# Patient Record
Sex: Female | Born: 1985 | Race: White | Hispanic: No | Smoking: Current every day smoker
Health system: Southern US, Community
[De-identification: ages and names within clinical notes are randomized; demographics above are authoritative.]

## PROBLEM LIST (undated history)

## (undated) ENCOUNTER — Inpatient Hospital Stay (HOSPITAL_COMMUNITY): Payer: Self-pay

## (undated) DIAGNOSIS — D6861 Antiphospholipid syndrome: Secondary | ICD-10-CM

## (undated) DIAGNOSIS — E079 Disorder of thyroid, unspecified: Secondary | ICD-10-CM

## (undated) DIAGNOSIS — D649 Anemia, unspecified: Secondary | ICD-10-CM

## (undated) DIAGNOSIS — F988 Other specified behavioral and emotional disorders with onset usually occurring in childhood and adolescence: Secondary | ICD-10-CM

## (undated) DIAGNOSIS — F419 Anxiety disorder, unspecified: Secondary | ICD-10-CM

## (undated) DIAGNOSIS — M542 Cervicalgia: Secondary | ICD-10-CM

## (undated) DIAGNOSIS — R519 Headache, unspecified: Secondary | ICD-10-CM

## (undated) DIAGNOSIS — N83209 Unspecified ovarian cyst, unspecified side: Secondary | ICD-10-CM

## (undated) DIAGNOSIS — J45909 Unspecified asthma, uncomplicated: Secondary | ICD-10-CM

## (undated) DIAGNOSIS — F329 Major depressive disorder, single episode, unspecified: Secondary | ICD-10-CM

## (undated) DIAGNOSIS — O99320 Drug use complicating pregnancy, unspecified trimester: Secondary | ICD-10-CM

## (undated) DIAGNOSIS — M1990 Unspecified osteoarthritis, unspecified site: Secondary | ICD-10-CM

## (undated) DIAGNOSIS — G2581 Restless legs syndrome: Secondary | ICD-10-CM

## (undated) DIAGNOSIS — M797 Fibromyalgia: Secondary | ICD-10-CM

## (undated) DIAGNOSIS — M549 Dorsalgia, unspecified: Secondary | ICD-10-CM

## (undated) DIAGNOSIS — J42 Unspecified chronic bronchitis: Secondary | ICD-10-CM

## (undated) DIAGNOSIS — N390 Urinary tract infection, site not specified: Secondary | ICD-10-CM

## (undated) DIAGNOSIS — D352 Benign neoplasm of pituitary gland: Secondary | ICD-10-CM

## (undated) DIAGNOSIS — F32A Depression, unspecified: Secondary | ICD-10-CM

## (undated) DIAGNOSIS — N200 Calculus of kidney: Secondary | ICD-10-CM

## (undated) DIAGNOSIS — F1120 Opioid dependence, uncomplicated: Secondary | ICD-10-CM

## (undated) HISTORY — PX: THERAPEUTIC ABORTION: SHX798

## (undated) HISTORY — DX: Unspecified chronic bronchitis: J42

## (undated) HISTORY — PX: WISDOM TOOTH EXTRACTION: SHX21

## (undated) HISTORY — DX: Opioid dependence, uncomplicated: F11.20

## (undated) HISTORY — PX: DILATION AND CURETTAGE OF UTERUS: SHX78

## (undated) HISTORY — DX: Antiphospholipid syndrome: D68.61

## (undated) HISTORY — DX: Drug use complicating pregnancy, unspecified trimester: O99.320

---

## 2008-08-08 ENCOUNTER — Emergency Department (HOSPITAL_COMMUNITY): Admission: EM | Admit: 2008-08-08 | Discharge: 2008-08-08 | Payer: Self-pay | Admitting: Emergency Medicine

## 2008-08-17 ENCOUNTER — Emergency Department (HOSPITAL_COMMUNITY): Admission: EM | Admit: 2008-08-17 | Discharge: 2008-08-17 | Payer: Self-pay | Admitting: Emergency Medicine

## 2008-12-12 ENCOUNTER — Observation Stay (HOSPITAL_COMMUNITY): Admission: EM | Admit: 2008-12-12 | Discharge: 2008-12-12 | Payer: Self-pay | Admitting: Emergency Medicine

## 2009-02-21 ENCOUNTER — Emergency Department (HOSPITAL_COMMUNITY): Admission: EM | Admit: 2009-02-21 | Discharge: 2009-02-21 | Payer: Self-pay | Admitting: Emergency Medicine

## 2009-04-16 ENCOUNTER — Inpatient Hospital Stay (HOSPITAL_COMMUNITY): Admission: AD | Admit: 2009-04-16 | Discharge: 2009-04-16 | Payer: Self-pay | Admitting: Obstetrics and Gynecology

## 2009-04-22 ENCOUNTER — Inpatient Hospital Stay (HOSPITAL_COMMUNITY): Admission: AD | Admit: 2009-04-22 | Discharge: 2009-04-22 | Payer: Self-pay | Admitting: Obstetrics and Gynecology

## 2009-05-11 ENCOUNTER — Emergency Department (HOSPITAL_COMMUNITY): Admission: EM | Admit: 2009-05-11 | Discharge: 2009-05-11 | Payer: Self-pay | Admitting: Emergency Medicine

## 2009-05-12 ENCOUNTER — Emergency Department (HOSPITAL_COMMUNITY): Admission: EM | Admit: 2009-05-12 | Discharge: 2009-05-12 | Payer: Self-pay | Admitting: Emergency Medicine

## 2009-10-26 ENCOUNTER — Emergency Department (HOSPITAL_COMMUNITY): Admission: EM | Admit: 2009-10-26 | Discharge: 2009-10-26 | Payer: Self-pay | Admitting: Emergency Medicine

## 2010-04-26 ENCOUNTER — Inpatient Hospital Stay (HOSPITAL_COMMUNITY): Admission: AD | Admit: 2010-04-26 | Discharge: 2010-04-26 | Payer: Self-pay | Admitting: Obstetrics and Gynecology

## 2010-04-26 ENCOUNTER — Ambulatory Visit: Payer: Self-pay | Admitting: Gynecology

## 2010-05-27 ENCOUNTER — Inpatient Hospital Stay (HOSPITAL_COMMUNITY): Admission: AD | Admit: 2010-05-27 | Discharge: 2010-05-30 | Payer: Self-pay | Admitting: Obstetrics and Gynecology

## 2010-08-14 ENCOUNTER — Emergency Department (HOSPITAL_COMMUNITY)
Admission: EM | Admit: 2010-08-14 | Discharge: 2010-08-14 | Payer: Self-pay | Source: Home / Self Care | Admitting: Emergency Medicine

## 2010-11-16 LAB — CBC
HCT: 35.2 % — ABNORMAL LOW (ref 36.0–46.0)
Hemoglobin: 12.1 g/dL (ref 12.0–15.0)
Hemoglobin: 13.7 g/dL (ref 12.0–15.0)
RBC: 3.74 MIL/uL — ABNORMAL LOW (ref 3.87–5.11)
RBC: 4.3 MIL/uL (ref 3.87–5.11)
RDW: 13.4 % (ref 11.5–15.5)
WBC: 9.6 10*3/uL (ref 4.0–10.5)

## 2010-11-16 LAB — RPR: RPR Ser Ql: NONREACTIVE

## 2010-11-17 LAB — URINALYSIS, ROUTINE W REFLEX MICROSCOPIC
Glucose, UA: NEGATIVE mg/dL
Hgb urine dipstick: NEGATIVE
Specific Gravity, Urine: 1.01 (ref 1.005–1.030)
Urobilinogen, UA: 0.2 mg/dL (ref 0.0–1.0)
pH: 7 (ref 5.0–8.0)

## 2010-12-08 LAB — URINE MICROSCOPIC-ADD ON

## 2010-12-08 LAB — HCG, QUANTITATIVE, PREGNANCY: hCG, Beta Chain, Quant, S: 64 m[IU]/mL — ABNORMAL HIGH (ref <5)

## 2010-12-08 LAB — URINALYSIS, ROUTINE W REFLEX MICROSCOPIC
Glucose, UA: NEGATIVE mg/dL
Hgb urine dipstick: NEGATIVE
Ketones, ur: NEGATIVE mg/dL
Nitrite: NEGATIVE
Protein, ur: NEGATIVE mg/dL
Specific Gravity, Urine: 1.026 (ref 1.005–1.030)
Urobilinogen, UA: 1 mg/dL (ref 0.0–1.0)
pH: 6 (ref 5.0–8.0)

## 2010-12-08 LAB — POCT I-STAT, CHEM 8
BUN: 11 mg/dL (ref 6–23)
Calcium, Ion: 1.12 mmol/L (ref 1.12–1.32)
Chloride: 108 mEq/L (ref 96–112)
Glucose, Bld: 81 mg/dL (ref 70–99)
HCT: 33 % — ABNORMAL LOW (ref 36.0–46.0)

## 2010-12-08 LAB — PREGNANCY, URINE: Preg Test, Ur: POSITIVE

## 2010-12-09 LAB — CBC
HCT: 28.3 % — ABNORMAL LOW (ref 36.0–46.0)
Hemoglobin: 9.8 g/dL — ABNORMAL LOW (ref 12.0–15.0)
MCHC: 34.5 g/dL (ref 30.0–36.0)
RDW: 12.7 % (ref 11.5–15.5)

## 2010-12-10 LAB — CBC
MCHC: 34.6 g/dL (ref 30.0–36.0)
MCV: 92.9 fL (ref 78.0–100.0)
Platelets: 238 10*3/uL (ref 150–400)
RDW: 12.9 % (ref 11.5–15.5)

## 2010-12-10 LAB — GC/CHLAMYDIA PROBE AMP, GENITAL: GC Probe Amp, Genital: NEGATIVE

## 2010-12-11 LAB — URINALYSIS, ROUTINE W REFLEX MICROSCOPIC
Bilirubin Urine: NEGATIVE
Glucose, UA: NEGATIVE mg/dL
Glucose, UA: NEGATIVE mg/dL
Specific Gravity, Urine: 1.024 (ref 1.005–1.030)
Specific Gravity, Urine: 1.025 (ref 1.005–1.030)
Urobilinogen, UA: 0.2 mg/dL (ref 0.0–1.0)
Urobilinogen, UA: 1 mg/dL (ref 0.0–1.0)

## 2010-12-11 LAB — URINE MICROSCOPIC-ADD ON

## 2010-12-13 LAB — COMPREHENSIVE METABOLIC PANEL
ALT: 14 U/L (ref 0–35)
AST: 21 U/L (ref 0–37)
Alkaline Phosphatase: 103 U/L (ref 39–117)
CO2: 23 mEq/L (ref 19–32)
Chloride: 103 mEq/L (ref 96–112)
GFR calc Af Amer: >60 mL/min (ref >60)
GFR calc non Af Amer: >60 mL/min (ref >60)
Potassium: 3.7 mEq/L (ref 3.5–5.1)
Sodium: 135 mEq/L (ref 135–145)
Total Bilirubin: 1.2 mg/dL (ref 0.3–1.2)

## 2010-12-13 LAB — RAPID URINE DRUG SCREEN, HOSP PERFORMED
Benzodiazepines: POSITIVE — AB
Cocaine: POSITIVE — AB
Tetrahydrocannabinol: POSITIVE — AB

## 2010-12-13 LAB — POCT CARDIAC MARKERS
CKMB, poc: <1 ng/mL — ABNORMAL LOW (ref 1.0–8.0)
Myoglobin, poc: 31.4 ng/mL (ref 12–200)
Troponin i, poc: <0.05 ng/mL (ref 0.00–0.09)

## 2010-12-13 LAB — POCT PREGNANCY, URINE: Preg Test, Ur: NEGATIVE

## 2010-12-17 ENCOUNTER — Emergency Department (HOSPITAL_COMMUNITY): Payer: Self-pay

## 2010-12-17 ENCOUNTER — Emergency Department (HOSPITAL_COMMUNITY)
Admission: EM | Admit: 2010-12-17 | Discharge: 2010-12-17 | Disposition: A | Discharge status: Home / Self Care | Payer: Self-pay | Attending: Emergency Medicine | Admitting: Emergency Medicine

## 2010-12-17 DIAGNOSIS — M533 Sacrococcygeal disorders, not elsewhere classified: Secondary | ICD-10-CM | POA: Insufficient documentation

## 2010-12-17 DIAGNOSIS — S9030XA Contusion of unspecified foot, initial encounter: Secondary | ICD-10-CM | POA: Insufficient documentation

## 2010-12-17 DIAGNOSIS — W108XXA Fall (on) (from) other stairs and steps, initial encounter: Secondary | ICD-10-CM | POA: Insufficient documentation

## 2010-12-17 DIAGNOSIS — S0990XA Unspecified injury of head, initial encounter: Secondary | ICD-10-CM | POA: Insufficient documentation

## 2010-12-17 DIAGNOSIS — S139XXA Sprain of joints and ligaments of unspecified parts of neck, initial encounter: Secondary | ICD-10-CM | POA: Insufficient documentation

## 2010-12-17 DIAGNOSIS — M25569 Pain in unspecified knee: Secondary | ICD-10-CM | POA: Insufficient documentation

## 2010-12-17 DIAGNOSIS — Y92009 Unspecified place in unspecified non-institutional (private) residence as the place of occurrence of the external cause: Secondary | ICD-10-CM | POA: Insufficient documentation

## 2010-12-17 DIAGNOSIS — S20229A Contusion of unspecified back wall of thorax, initial encounter: Secondary | ICD-10-CM | POA: Insufficient documentation

## 2011-03-19 ENCOUNTER — Inpatient Hospital Stay (HOSPITAL_COMMUNITY)
Admission: RE | Admit: 2011-03-19 | Discharge: 2011-03-19 | Disposition: A | Discharge status: Home / Self Care | Payer: Self-pay | Source: Ambulatory Visit | Attending: Emergency Medicine | Admitting: Emergency Medicine

## 2011-03-20 ENCOUNTER — Inpatient Hospital Stay (INDEPENDENT_AMBULATORY_CARE_PROVIDER_SITE_OTHER)
Admission: RE | Admit: 2011-03-20 | Discharge: 2011-03-20 | Disposition: A | Discharge status: Home / Self Care | Payer: Self-pay | Source: Ambulatory Visit | Attending: Family Medicine | Admitting: Family Medicine

## 2011-03-20 DIAGNOSIS — B86 Scabies: Secondary | ICD-10-CM

## 2011-04-16 ENCOUNTER — Inpatient Hospital Stay (INDEPENDENT_AMBULATORY_CARE_PROVIDER_SITE_OTHER)
Admission: RE | Admit: 2011-04-16 | Discharge: 2011-04-16 | Disposition: A | Discharge status: Home / Self Care | Payer: Self-pay | Source: Ambulatory Visit | Attending: Family Medicine | Admitting: Family Medicine

## 2011-04-16 DIAGNOSIS — K089 Disorder of teeth and supporting structures, unspecified: Secondary | ICD-10-CM

## 2011-04-22 ENCOUNTER — Emergency Department (HOSPITAL_COMMUNITY): Payer: Self-pay

## 2011-04-22 ENCOUNTER — Emergency Department (HOSPITAL_COMMUNITY)
Admission: EM | Admit: 2011-04-22 | Discharge: 2011-04-22 | Disposition: A | Discharge status: Home / Self Care | Payer: Self-pay | Attending: Emergency Medicine | Admitting: Emergency Medicine

## 2011-04-22 DIAGNOSIS — M26629 Arthralgia of temporomandibular joint, unspecified side: Secondary | ICD-10-CM | POA: Insufficient documentation

## 2011-04-22 DIAGNOSIS — Z79899 Other long term (current) drug therapy: Secondary | ICD-10-CM | POA: Insufficient documentation

## 2011-04-22 DIAGNOSIS — R6884 Jaw pain: Secondary | ICD-10-CM | POA: Insufficient documentation

## 2011-04-22 DIAGNOSIS — F341 Dysthymic disorder: Secondary | ICD-10-CM | POA: Insufficient documentation

## 2011-05-07 ENCOUNTER — Emergency Department (HOSPITAL_COMMUNITY)
Admission: EM | Admit: 2011-05-07 | Discharge: 2011-05-07 | Discharge status: Against Medical Advice | Payer: Self-pay | Attending: Emergency Medicine | Admitting: Emergency Medicine

## 2011-05-07 DIAGNOSIS — R059 Cough, unspecified: Secondary | ICD-10-CM | POA: Insufficient documentation

## 2011-05-07 DIAGNOSIS — R05 Cough: Secondary | ICD-10-CM | POA: Insufficient documentation

## 2011-05-07 DIAGNOSIS — R0989 Other specified symptoms and signs involving the circulatory and respiratory systems: Secondary | ICD-10-CM | POA: Insufficient documentation

## 2011-05-07 DIAGNOSIS — R070 Pain in throat: Secondary | ICD-10-CM | POA: Insufficient documentation

## 2011-05-08 ENCOUNTER — Inpatient Hospital Stay (INDEPENDENT_AMBULATORY_CARE_PROVIDER_SITE_OTHER)
Admission: RE | Admit: 2011-05-08 | Discharge: 2011-05-08 | Disposition: A | Discharge status: Home / Self Care | Payer: Self-pay | Source: Ambulatory Visit | Attending: Family Medicine | Admitting: Family Medicine

## 2011-05-08 DIAGNOSIS — G43009 Migraine without aura, not intractable, without status migrainosus: Secondary | ICD-10-CM

## 2011-05-08 DIAGNOSIS — J45909 Unspecified asthma, uncomplicated: Secondary | ICD-10-CM

## 2011-05-10 ENCOUNTER — Emergency Department (HOSPITAL_COMMUNITY)
Admission: EM | Admit: 2011-05-10 | Discharge: 2011-05-10 | Discharge status: Against Medical Advice | Payer: Self-pay | Attending: Emergency Medicine | Admitting: Emergency Medicine

## 2011-05-10 DIAGNOSIS — Z0389 Encounter for observation for other suspected diseases and conditions ruled out: Secondary | ICD-10-CM | POA: Insufficient documentation

## 2011-06-08 LAB — BASIC METABOLIC PANEL
BUN: 8 mg/dL (ref 6–23)
Calcium: 9.5 mg/dL (ref 8.4–10.5)
GFR calc non Af Amer: >60 mL/min (ref >60)
Potassium: 3.8 mEq/L (ref 3.5–5.1)
Sodium: 139 mEq/L (ref 135–145)

## 2011-06-08 LAB — DIFFERENTIAL
Basophils Absolute: 0 10*3/uL (ref 0.0–0.1)
Eosinophils Relative: 4 % (ref 0–5)
Lymphocytes Relative: 41 % (ref 12–46)
Lymphs Abs: 3.3 10*3/uL (ref 0.7–4.0)
Neutro Abs: 3.8 10*3/uL (ref 1.7–7.7)

## 2011-06-08 LAB — CBC
HCT: 43.8 % (ref 36.0–46.0)
Platelets: 261 10*3/uL (ref 150–400)
WBC: 8.1 10*3/uL (ref 4.0–10.5)

## 2011-06-08 LAB — D-DIMER, QUANTITATIVE: D-Dimer, Quant: <0.22 ug/mL-FEU (ref 0.00–0.48)

## 2012-06-07 ENCOUNTER — Emergency Department (INDEPENDENT_AMBULATORY_CARE_PROVIDER_SITE_OTHER)
Admission: EM | Admit: 2012-06-07 | Discharge: 2012-06-07 | Disposition: A | Discharge status: Home / Self Care | Payer: Medicaid - Out of State | Source: Home / Self Care | Attending: Emergency Medicine | Admitting: Emergency Medicine

## 2012-06-07 ENCOUNTER — Encounter (HOSPITAL_COMMUNITY): Payer: Self-pay | Admitting: *Deleted

## 2012-06-07 DIAGNOSIS — J209 Acute bronchitis, unspecified: Secondary | ICD-10-CM

## 2012-06-07 DIAGNOSIS — J45909 Unspecified asthma, uncomplicated: Secondary | ICD-10-CM

## 2012-06-07 HISTORY — DX: Fibromyalgia: M79.7

## 2012-06-07 MED ORDER — ALBUTEROL SULFATE HFA 108 (90 BASE) MCG/ACT IN AERS: 1.0000 | INHALATION_SPRAY | Freq: Four times a day (QID) | RESPIRATORY_TRACT | Status: DC | PRN

## 2012-06-07 MED ORDER — PREDNISONE 5 MG PO KIT: 1.0000 | PACK | Freq: Every day | ORAL | Status: DC

## 2012-06-07 MED ORDER — BENZONATATE 200 MG PO CAPS: 200.0000 mg | ORAL_CAPSULE | Freq: Three times a day (TID) | ORAL | Status: DC | PRN

## 2012-06-07 MED ORDER — AMOXICILLIN 500 MG PO CAPS: 1000.0000 mg | ORAL_CAPSULE | Freq: Three times a day (TID) | ORAL | Status: DC

## 2012-06-07 NOTE — ED Notes (Signed)
Pt    Caregiver  Reports  Symptoms   Of  Cough  /  Congestion  As  Well  As    stuffynose       She  Ambulated  To  Room  With a  Steady  Fluid  Gait  She       Appears  In no  Acute  Distress            Skin is  Warm  And    Dry

## 2012-06-07 NOTE — ED Provider Notes (Signed)
Chief Complaint  Patient presents with  . Cough    History of Present Illness:   The patient is a 26 year old female who presents today with a four-day history of cough productive of green sputum, wheezing, and chest tightness. She has a history of asthma which she treats with as needed albuterol. She has also some nasal congestion without any drainage, migraine headache, sweats, and sore throat. She denies any fever or chills. She's had no chest pain or GI complaints. She also has a history of fibromyalgia for which he takes Neurontin, anxiety for which she takes alprazolam, and hypothyroidism for which she takes levothyroxine.  Review of Systems:  Other than noted above, the patient denies any of the following symptoms. Systemic:  No fever, chills, sweats, fatigue, myalgias, headache, or anorexia. Eye:  No redness, pain or drainage. ENT:  No earache, ear congestion, nasal congestion, sneezing, rhinorrhea, sinus pressure, sinus pain, post nasal drip, or sore throat. Lungs:  No cough, sputum production, wheezing, shortness of breath, or chest pain. GI:  No abdominal pain, nausea, vomiting, or diarrhea.  PMFSH:  Past medical history, family history, social history, meds, and allergies were reviewed.  Physical Exam:   Vital signs:  BP 126/68  Pulse 90  Temp 98.1 F (36.7 C) (Oral)  Resp 18  SpO2 99%  LMP 06/07/2012 General:  Alert, in no distress. Eye:  No conjunctival injection or drainage. Lids were normal. ENT:  TMs and canals were normal, without erythema or inflammation.  Nasal mucosa was clear and uncongested, without drainage.  Mucous membranes were moist.  Pharynx was clear, without exudate or drainage.  There were no oral ulcerations or lesions. Neck:  Supple, no adenopathy, tenderness or mass. Lungs:  No respiratory distress.  Lungs were clear to auscultation, without wheezes, rales or rhonchi.  Breath sounds were clear and equal bilaterally.  Heart:  Regular rhythm, without  gallops, murmers or rubs. Skin:  Clear, warm, and dry, without rash or lesions.  Assessment:  The primary encounter diagnosis was Acute bronchitis. A diagnosis of Asthma was also pertinent to this visit.  Plan:   1.  The following meds were prescribed:   New Prescriptions   ALBUTEROL (PROVENTIL HFA;VENTOLIN HFA) 108 (90 BASE) MCG/ACT INHALER    Inhale 1-2 puffs into the lungs every 6 (six) hours as needed for wheezing.   AMOXICILLIN (AMOXIL) 500 MG CAPSULE    Take 2 capsules (1,000 mg total) by mouth 3 (three) times daily.   BENZONATATE (TESSALON) 200 MG CAPSULE    Take 1 capsule (200 mg total) by mouth 3 (three) times daily as needed for cough.   PREDNISONE 5 MG KIT    Take 1 kit (5 mg total) by mouth daily after breakfast. Prednisone 5 mg 6 day dosepack.  Take as directed.   2.  The patient was instructed in symptomatic care and handouts were given. 3.  The patient was told to return if becoming worse in any way, if no better in 3 or 4 days, and given some red flag symptoms that would indicate earlier return.   Reuben Likes, MD 06/07/12 [phone]

## 2012-06-10 ENCOUNTER — Encounter (HOSPITAL_COMMUNITY): Payer: Self-pay | Admitting: Emergency Medicine

## 2012-06-10 ENCOUNTER — Emergency Department (INDEPENDENT_AMBULATORY_CARE_PROVIDER_SITE_OTHER)
Admission: EM | Admit: 2012-06-10 | Discharge: 2012-06-10 | Disposition: A | Discharge status: Home / Self Care | Payer: Self-pay | Source: Home / Self Care | Attending: Family Medicine | Admitting: Family Medicine

## 2012-06-10 DIAGNOSIS — Z760 Encounter for issue of repeat prescription: Secondary | ICD-10-CM

## 2012-06-10 DIAGNOSIS — F411 Generalized anxiety disorder: Secondary | ICD-10-CM

## 2012-06-10 DIAGNOSIS — F329 Major depressive disorder, single episode, unspecified: Secondary | ICD-10-CM

## 2012-06-10 HISTORY — DX: Unspecified asthma, uncomplicated: J45.909

## 2012-06-10 HISTORY — DX: Other specified behavioral and emotional disorders with onset usually occurring in childhood and adolescence: F98.8

## 2012-06-10 HISTORY — DX: Dorsalgia, unspecified: M54.9

## 2012-06-10 HISTORY — DX: Anxiety disorder, unspecified: F41.9

## 2012-06-10 HISTORY — DX: Cervicalgia: M54.2

## 2012-06-10 HISTORY — DX: Unspecified osteoarthritis, unspecified site: M19.90

## 2012-06-10 HISTORY — DX: Major depressive disorder, single episode, unspecified: F32.9

## 2012-06-10 HISTORY — DX: Depression, unspecified: F32.A

## 2012-06-10 MED ORDER — METHYLPHENIDATE HCL 20 MG PO TABS: ORAL_TABLET | ORAL | Status: DC

## 2012-06-10 MED ORDER — ALPRAZOLAM 1 MG PO TABS: ORAL_TABLET | ORAL | Status: DC

## 2012-06-10 NOTE — ED Notes (Signed)
Requesting refill of xanax and methylphenidate.  Reports being out of medicines for one week. Reports primary is in [place].

## 2012-06-10 NOTE — ED Provider Notes (Signed)
Medical screening examination/treatment/procedure(s) were performed by resident physician or non-physician practitioner and as supervising physician I was immediately available for consultation/collaboration.   Kailey Esquilin DOUGLAS MD.    Siri Buege D Sheehan Stacey, MD 06/10/12 2100 

## 2012-06-10 NOTE — ED Provider Notes (Signed)
History     CSN: [id number]  Arrival date & time 06/10/12  1722   First MD Initiated Contact with Patient 06/10/12 1829      Chief Complaint  Patient presents with  . Medication Refill    (Consider location/radiation/quality/duration/timing/severity/associated sxs/prior treatment) HPI Comments: States she is up her temporarily from [place].  She has run out of meds and needs refills.  Is thinking about moving here permanently.  The history is provided by the patient. No language interpreter was used.    Past Medical History  Diagnosis Date  . Fibromyalgia   . Arthritis   . Asthma   . Neck pain   . Back pain   . Anxiety   . Depression   . Attention deficit disorder (ADD)     History reviewed. No pertinent past surgical history.  No family history on file.  History  Substance Use Topics  . Smoking status: Current Every Day Smoker  . Smokeless tobacco: Not on file  . Alcohol Use: Yes    OB History    Grav Para Term Preterm Abortions TAB SAB Ect Mult Living                  Review of Systems  Constitutional: Negative for fever and chills.  Psychiatric/Behavioral: Negative for confusion.  All other systems reviewed and are negative.    Allergies  Review of patient's allergies indicates no known allergies.  Home Medications   Current Outpatient Rx  Name Route Sig Dispense Refill  . GABAPENTIN 300 MG PO CAPS Oral Take 300 mg by mouth 3 (three) times daily.    Marland Kitchen LEVOTHYROXINE SODIUM 25 MCG PO TABS Oral Take 25 mcg by mouth daily.    . TRAMADOL HCL 50 MG PO TABS Oral Take 50 mg by mouth every 6 (six) hours as needed.    . ALBUTEROL SULFATE HFA 108 (90 BASE) MCG/ACT IN AERS Inhalation Inhale 1-2 puffs into the lungs every 6 (six) hours as needed for wheezing. 1 Inhaler 0  . ALPRAZOLAM 1 MG PO TABS  One tab po TID 42 tablet 0  . AMOXICILLIN 500 MG PO CAPS Oral Take 2 capsules (1,000 mg total) by mouth 3 (three) times daily. 60 capsule 0    Dispense as  written.  Marland Kitchen BENZONATATE 200 MG PO CAPS Oral Take 1 capsule (200 mg total) by mouth 3 (three) times daily as needed for cough. 30 capsule 0  . METHYLPHENIDATE HCL 20 MG PO TABS  One tab po TID 42 tablet 0  . PREDNISONE 5 MG PO KIT Oral Take 1 kit (5 mg total) by mouth daily after breakfast. Prednisone 5 mg 6 day dosepack.  Take as directed. 1 kit 0    BP 118/66  Pulse 74  Temp 98.2 F (36.8 C) (Oral)  Resp 16  SpO2 98%  LMP 06/07/2012  Physical Exam  Nursing note and vitals reviewed. Constitutional: She is oriented to person, place, and time. She appears well-developed and well-nourished. No distress.  HENT:  Head: Normocephalic and atraumatic.  Eyes: EOM are normal.  Neck: Normal range of motion.  Cardiovascular: Normal rate, regular rhythm and normal heart sounds.   Pulmonary/Chest: Effort normal and breath sounds normal.  Abdominal: Soft. She exhibits no distension. There is no tenderness.  Musculoskeletal: Normal range of motion.  Neurological: She is alert and oriented to person, place, and time. Coordination and gait normal. GCS eye subscore is 4. GCS verbal subscore is 5. GCS motor subscore is  6.  Skin: Skin is warm and dry.  Psychiatric: She has a normal mood and affect. Judgment normal.    ED Course  Procedures (including critical care time)  Labs Reviewed - No data to display No results found.   1. Medication refill       MDM  [id number]-42 rx-ritalin ,20 Find a PCP        Evalina Field, [place] 06/10/12 1940

## 2012-09-15 ENCOUNTER — Encounter (HOSPITAL_COMMUNITY): Payer: Self-pay | Admitting: *Deleted

## 2012-09-15 ENCOUNTER — Emergency Department (HOSPITAL_COMMUNITY)
Admission: EM | Admit: 2012-09-15 | Discharge: 2012-09-15 | Discharge status: Against Medical Advice | Payer: Medicaid Other | Attending: Emergency Medicine | Admitting: Emergency Medicine

## 2012-09-15 DIAGNOSIS — J029 Acute pharyngitis, unspecified: Secondary | ICD-10-CM | POA: Insufficient documentation

## 2012-09-15 DIAGNOSIS — R197 Diarrhea, unspecified: Secondary | ICD-10-CM | POA: Insufficient documentation

## 2012-09-15 DIAGNOSIS — R05 Cough: Secondary | ICD-10-CM | POA: Insufficient documentation

## 2012-09-15 DIAGNOSIS — Z3202 Encounter for pregnancy test, result negative: Secondary | ICD-10-CM | POA: Insufficient documentation

## 2012-09-15 DIAGNOSIS — R509 Fever, unspecified: Secondary | ICD-10-CM | POA: Insufficient documentation

## 2012-09-15 DIAGNOSIS — R059 Cough, unspecified: Secondary | ICD-10-CM | POA: Insufficient documentation

## 2012-09-15 DIAGNOSIS — R51 Headache: Secondary | ICD-10-CM | POA: Insufficient documentation

## 2012-09-15 LAB — BASIC METABOLIC PANEL
Calcium: 10.2 mg/dL (ref 8.4–10.5)
Chloride: 102 mEq/L (ref 96–112)
Creatinine, Ser: 0.71 mg/dL (ref 0.50–1.10)
GFR calc Af Amer: >90 mL/min (ref >90)
GFR calc non Af Amer: >90 mL/min (ref >90)

## 2012-09-15 LAB — CBC WITH DIFFERENTIAL/PLATELET
Basophils Absolute: 0 10*3/uL (ref 0.0–0.1)
Basophils Relative: 0 % (ref 0–1)
Eosinophils Absolute: 0.1 10*3/uL (ref 0.0–0.7)
Eosinophils Relative: 1 % (ref 0–5)
HCT: 45.1 % (ref 36.0–46.0)
MCHC: 32.8 g/dL (ref 30.0–36.0)
Monocytes Absolute: 0.5 10*3/uL (ref 0.1–1.0)
Neutro Abs: 4.3 10*3/uL (ref 1.7–7.7)
RDW: 13.1 % (ref 11.5–15.5)

## 2012-09-15 LAB — URINALYSIS, ROUTINE W REFLEX MICROSCOPIC
Ketones, ur: NEGATIVE mg/dL
Nitrite: NEGATIVE
Protein, ur: NEGATIVE mg/dL
Urobilinogen, UA: 1 mg/dL (ref 0.0–1.0)

## 2012-09-15 LAB — LIPASE, BLOOD: Lipase: 17 U/L (ref 11–59)

## 2012-09-15 NOTE — ED Notes (Signed)
Called for triage no answer in the lobby

## 2012-09-15 NOTE — ED Notes (Signed)
Pt is here with symptoms over the last 4 days of headache, sorethroat, diarrhea, abdominal pain, chills, and fever

## 2012-10-03 ENCOUNTER — Emergency Department (INDEPENDENT_AMBULATORY_CARE_PROVIDER_SITE_OTHER)
Admission: EM | Admit: 2012-10-03 | Discharge: 2012-10-03 | Disposition: A | Discharge status: Home / Self Care | Payer: Medicaid Other | Source: Home / Self Care

## 2012-10-03 ENCOUNTER — Emergency Department (INDEPENDENT_AMBULATORY_CARE_PROVIDER_SITE_OTHER): Payer: Medicaid Other

## 2012-10-03 ENCOUNTER — Encounter (HOSPITAL_COMMUNITY): Payer: Self-pay | Admitting: *Deleted

## 2012-10-03 DIAGNOSIS — M7120 Synovial cyst of popliteal space [Baker], unspecified knee: Secondary | ICD-10-CM

## 2012-10-03 DIAGNOSIS — M660 Rupture of popliteal cyst: Secondary | ICD-10-CM

## 2012-10-03 HISTORY — DX: Disorder of thyroid, unspecified: E07.9

## 2012-10-03 MED ORDER — NAPROXEN 500 MG PO TABS: 500.0000 mg | ORAL_TABLET | Freq: Two times a day (BID) | ORAL | Status: DC

## 2012-10-03 MED ORDER — HYDROCODONE-ACETAMINOPHEN 5-325 MG PO TABS: 1.0000 | ORAL_TABLET | Freq: Four times a day (QID) | ORAL | Status: DC | PRN

## 2012-10-03 NOTE — ED Provider Notes (Signed)
History     CSN: [id number]  Arrival date & time 10/03/12  1810   First MD Initiated Contact with Patient 10/03/12 1826      No chief complaint on file.   (Consider location/radiation/quality/duration/timing/severity/associated sxs/prior treatment) HPI 27 y.o. female with right knee/leg pain since slipping/falling on stairs 3-4 days ago. [place] Seat forward going upstairs and hit right knee against concrete step. Immediate pain, swelling, difficulty bearing weight. Has been using ice, heat, tylenol/NSAIDs, tramadol and elevation. Not getting any better. Swelling has decreased. Had pain/tightness down entire calf and part way up thigh. This has improved greatly. Has some tingling in plantar aspect of foot when bears weight still. Felt popping sensation when bearing weight.  Pain is medial aspect of knee and back of knee. Walking, pressure, flexing, any movement hurts. Has history of knee problems since adolescences when she was active in cheerleading. But no pain until recent fall.   Past Medical History  Diagnosis Date  . Fibromyalgia   . Arthritis   . Asthma   . Neck pain   . Back pain   . Anxiety   . Depression   . Attention deficit disorder (ADD)    History reviewed. No pertinent past surgical history.  No family history on file.  History  Substance Use Topics  . Smoking status: Current Every Day Smoker  . Smokeless tobacco: Not on file  . Alcohol Use: Yes    OB History    Grav Para Term Preterm Abortions TAB SAB Ect Mult Living                  Review of Systems  Constitutional: Negative for fever, chills and diaphoresis.  Respiratory: Negative for shortness of breath.   Cardiovascular: Negative for chest pain and palpitations.  Musculoskeletal:       See HPI  Skin: Negative for rash.  Neurological: Negative for dizziness.    Allergies  Review of patient's allergies indicates no known allergies.  Home Medications   Current Outpatient Rx  Name  Route  Sig   Dispense  Refill  . ALBUTEROL SULFATE HFA 108 (90 BASE) MCG/ACT IN AERS   Inhalation   Inhale 1-2 puffs into the lungs every 6 (six) hours as needed for wheezing.   1 Inhaler   0   . ALPRAZOLAM 1 MG PO TABS      One tab po TID   42 tablet   0   . AMOXICILLIN 500 MG PO CAPS   Oral   Take 2 capsules (1,000 mg total) by mouth 3 (three) times daily.   60 capsule   0     Dispense as written.   Marland Kitchen BENZONATATE 200 MG PO CAPS   Oral   Take 1 capsule (200 mg total) by mouth 3 (three) times daily as needed for cough.   30 capsule   0   . GABAPENTIN 300 MG PO CAPS   Oral   Take 300 mg by mouth 3 (three) times daily.         Marland Kitchen LEVOTHYROXINE SODIUM 25 MCG PO TABS   Oral   Take 25 mcg by mouth daily.         . METHYLPHENIDATE HCL 20 MG PO TABS      One tab po TID   42 tablet   0   . PREDNISONE 5 MG PO KIT   Oral   Take 1 kit (5 mg total) by mouth daily after breakfast. Prednisone 5 mg 6  day dosepack.  Take as directed.   1 kit   0   . TRAMADOL HCL 50 MG PO TABS   Oral   Take 50 mg by mouth every 6 (six) hours as needed.           There were no vitals taken for this visit.  Physical Exam  Constitutional: She is oriented to person, place, and time. She appears well-developed and well-nourished. No distress.  HENT:  Head: Normocephalic and atraumatic.  Eyes: Conjunctivae normal and EOM are normal.  Neck: Normal range of motion. Neck supple.  Cardiovascular: Normal rate, regular rhythm and normal heart sounds.   Pulmonary/Chest: Effort normal and breath sounds normal. No respiratory distress.  Musculoskeletal:       R knee with effusion. Not red or warm. Tender diffusely but especially medial aspect along joint line. Pain with passive and active flexion, with valgus pressure. Some discoloration of skin over patellar area. Sensation intact. Pulses intact in R foot, foot warm and well perfused. No calf tenderness, cords, swelling, redness.  Neurological: She is alert  and oriented to person, place, and time.  Skin: Skin is warm and dry.  Psychiatric: She has a normal mood and affect.   Filed Vitals:   10/03/12 1902  BP: 110/72  Pulse: 72  Temp: 98.7 F (37.1 C)  Resp: 16    ED Course  Procedures (including critical care time)  Labs Reviewed - No data to display Dg Knee Complete 4 Views Right  10/03/2012  *RADIOLOGY REPORT*  Clinical Data: Fall, knee injury  RIGHT KNEE - COMPLETE 4+ VIEW  Comparison: None.  Findings: No fracture or dislocation is seen.  The joint spaces are preserved.  The visualized soft tissues are unremarkable.  No suprapatellar knee joint effusion.  IMPRESSION: Normal knee radiographs.   Original Report Authenticated By: Charline Bills, M.D.     1. Ruptured synovial cyst of popliteal space   History and exam consistent with ruptured popliteal cyst vs meniscal injury. No fracture. Neuro-vascularly intact in calf and foot Short knee immobilizer for comfort, crutches NSAIDs, ice, rest, elevation Pt advised to seek f/u with a primary care physician or orthopedist.  MDM          Napoleon Form, MD 10/06/12 [phone]

## 2012-10-03 NOTE — ED Notes (Signed)
Going up steps and missed a step.  Hit R knee on cement step on 1/27.  C/o pain, redness and swelling to R ant. Knee.  When she bears wt. She gets tingling sensation in her instep.  Said her foot was asleep for 2 days when it was swelled.

## 2012-10-12 ENCOUNTER — Encounter (HOSPITAL_COMMUNITY): Payer: Self-pay | Admitting: Emergency Medicine

## 2012-10-12 ENCOUNTER — Emergency Department (HOSPITAL_COMMUNITY): Payer: Medicaid Other

## 2012-10-12 ENCOUNTER — Emergency Department (HOSPITAL_COMMUNITY)
Admission: EM | Admit: 2012-10-12 | Discharge: 2012-10-12 | Disposition: A | Discharge status: Home / Self Care | Payer: Medicaid Other | Attending: Emergency Medicine | Admitting: Emergency Medicine

## 2012-10-12 DIAGNOSIS — T7411XA Adult physical abuse, confirmed, initial encounter: Secondary | ICD-10-CM | POA: Insufficient documentation

## 2012-10-12 DIAGNOSIS — F17200 Nicotine dependence, unspecified, uncomplicated: Secondary | ICD-10-CM | POA: Insufficient documentation

## 2012-10-12 DIAGNOSIS — E079 Disorder of thyroid, unspecified: Secondary | ICD-10-CM | POA: Insufficient documentation

## 2012-10-12 DIAGNOSIS — Z79899 Other long term (current) drug therapy: Secondary | ICD-10-CM | POA: Insufficient documentation

## 2012-10-12 DIAGNOSIS — J45909 Unspecified asthma, uncomplicated: Secondary | ICD-10-CM | POA: Insufficient documentation

## 2012-10-12 DIAGNOSIS — F988 Other specified behavioral and emotional disorders with onset usually occurring in childhood and adolescence: Secondary | ICD-10-CM | POA: Insufficient documentation

## 2012-10-12 DIAGNOSIS — F3289 Other specified depressive episodes: Secondary | ICD-10-CM | POA: Insufficient documentation

## 2012-10-12 DIAGNOSIS — F329 Major depressive disorder, single episode, unspecified: Secondary | ICD-10-CM | POA: Insufficient documentation

## 2012-10-12 DIAGNOSIS — Z8739 Personal history of other diseases of the musculoskeletal system and connective tissue: Secondary | ICD-10-CM | POA: Insufficient documentation

## 2012-10-12 DIAGNOSIS — S0990XA Unspecified injury of head, initial encounter: Secondary | ICD-10-CM | POA: Insufficient documentation

## 2012-10-12 DIAGNOSIS — F411 Generalized anxiety disorder: Secondary | ICD-10-CM | POA: Insufficient documentation

## 2012-10-12 DIAGNOSIS — S99919A Unspecified injury of unspecified ankle, initial encounter: Secondary | ICD-10-CM | POA: Insufficient documentation

## 2012-10-12 DIAGNOSIS — S0180XA Unspecified open wound of other part of head, initial encounter: Secondary | ICD-10-CM | POA: Insufficient documentation

## 2012-10-12 DIAGNOSIS — S8990XA Unspecified injury of unspecified lower leg, initial encounter: Secondary | ICD-10-CM | POA: Insufficient documentation

## 2012-10-12 DIAGNOSIS — IMO0002 Reserved for concepts with insufficient information to code with codable children: Secondary | ICD-10-CM

## 2012-10-12 DIAGNOSIS — T07XXXA Unspecified multiple injuries, initial encounter: Secondary | ICD-10-CM | POA: Insufficient documentation

## 2012-10-12 MED ORDER — HYDROCODONE-ACETAMINOPHEN 7.5-325 MG/15ML PO SOLN: ORAL | Status: DC

## 2012-10-12 MED ORDER — IBUPROFEN 800 MG PO TABS: 800.0000 mg | ORAL_TABLET | Freq: Three times a day (TID) | ORAL | Status: DC

## 2012-10-12 MED ORDER — OXYCODONE-ACETAMINOPHEN 5-325 MG PO TABS
2.0000 | ORAL_TABLET | Freq: Once | ORAL | Status: AC
  Administered 2012-10-12: 2 via ORAL
  Filled 2012-10-12: qty 2

## 2012-10-12 MED ORDER — CEPHALEXIN 500 MG PO CAPS: 500.0000 mg | ORAL_CAPSULE | Freq: Four times a day (QID) | ORAL | Status: DC
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
## 2012-11-16 ENCOUNTER — Emergency Department (HOSPITAL_COMMUNITY)
Admission: EM | Admit: 2012-11-16 | Discharge: 2012-11-17 | Disposition: A | Discharge status: Home / Self Care | Payer: No Typology Code available for payment source | Attending: Emergency Medicine | Admitting: Emergency Medicine

## 2012-11-16 ENCOUNTER — Emergency Department (HOSPITAL_COMMUNITY): Payer: No Typology Code available for payment source

## 2012-11-16 DIAGNOSIS — J45909 Unspecified asthma, uncomplicated: Secondary | ICD-10-CM | POA: Insufficient documentation

## 2012-11-16 DIAGNOSIS — S93401A Sprain of unspecified ligament of right ankle, initial encounter: Secondary | ICD-10-CM

## 2012-11-16 DIAGNOSIS — Y92410 Unspecified street and highway as the place of occurrence of the external cause: Secondary | ICD-10-CM | POA: Insufficient documentation

## 2012-11-16 DIAGNOSIS — F17200 Nicotine dependence, unspecified, uncomplicated: Secondary | ICD-10-CM | POA: Insufficient documentation

## 2012-11-16 DIAGNOSIS — F988 Other specified behavioral and emotional disorders with onset usually occurring in childhood and adolescence: Secondary | ICD-10-CM | POA: Insufficient documentation

## 2012-11-16 DIAGNOSIS — F3289 Other specified depressive episodes: Secondary | ICD-10-CM | POA: Insufficient documentation

## 2012-11-16 DIAGNOSIS — Y9389 Activity, other specified: Secondary | ICD-10-CM | POA: Insufficient documentation

## 2012-11-16 DIAGNOSIS — S99929A Unspecified injury of unspecified foot, initial encounter: Secondary | ICD-10-CM | POA: Insufficient documentation

## 2012-11-16 DIAGNOSIS — S0181XA Laceration without foreign body of other part of head, initial encounter: Secondary | ICD-10-CM

## 2012-11-16 DIAGNOSIS — F411 Generalized anxiety disorder: Secondary | ICD-10-CM | POA: Insufficient documentation

## 2012-11-16 DIAGNOSIS — S0990XA Unspecified injury of head, initial encounter: Secondary | ICD-10-CM | POA: Insufficient documentation

## 2012-11-16 DIAGNOSIS — Z3202 Encounter for pregnancy test, result negative: Secondary | ICD-10-CM | POA: Insufficient documentation

## 2012-11-16 DIAGNOSIS — Z8639 Personal history of other endocrine, nutritional and metabolic disease: Secondary | ICD-10-CM | POA: Insufficient documentation

## 2012-11-16 DIAGNOSIS — Z23 Encounter for immunization: Secondary | ICD-10-CM | POA: Insufficient documentation

## 2012-11-16 DIAGNOSIS — Z8739 Personal history of other diseases of the musculoskeletal system and connective tissue: Secondary | ICD-10-CM | POA: Insufficient documentation

## 2012-11-16 DIAGNOSIS — S0180XA Unspecified open wound of other part of head, initial encounter: Secondary | ICD-10-CM | POA: Insufficient documentation

## 2012-11-16 DIAGNOSIS — Z79899 Other long term (current) drug therapy: Secondary | ICD-10-CM | POA: Insufficient documentation

## 2012-11-16 DIAGNOSIS — S8990XA Unspecified injury of unspecified lower leg, initial encounter: Secondary | ICD-10-CM | POA: Insufficient documentation

## 2012-11-16 DIAGNOSIS — F329 Major depressive disorder, single episode, unspecified: Secondary | ICD-10-CM | POA: Insufficient documentation

## 2012-11-16 DIAGNOSIS — S6990XA Unspecified injury of unspecified wrist, hand and finger(s), initial encounter: Secondary | ICD-10-CM | POA: Insufficient documentation

## 2012-11-16 DIAGNOSIS — Z862 Personal history of diseases of the blood and blood-forming organs and certain disorders involving the immune mechanism: Secondary | ICD-10-CM | POA: Insufficient documentation

## 2012-11-16 DIAGNOSIS — N390 Urinary tract infection, site not specified: Secondary | ICD-10-CM

## 2012-11-16 LAB — POCT I-STAT, CHEM 8
Calcium, Ion: 1.14 mmol/L (ref 1.12–1.23)
Chloride: 105 mEq/L (ref 96–112)
HCT: 49 % — ABNORMAL HIGH (ref 36.0–46.0)
Hemoglobin: 16.7 g/dL — ABNORMAL HIGH (ref 12.0–15.0)
TCO2: 28 mmol/L (ref 0–100)

## 2012-11-16 LAB — URINALYSIS, ROUTINE W REFLEX MICROSCOPIC
Glucose, UA: NEGATIVE mg/dL
Nitrite: NEGATIVE
Protein, ur: NEGATIVE mg/dL

## 2012-11-16 LAB — PREGNANCY, URINE: Preg Test, Ur: NEGATIVE

## 2012-11-16 LAB — CBC WITH DIFFERENTIAL/PLATELET
Eosinophils Relative: 1 % (ref 0–5)
HCT: 45.8 % (ref 36.0–46.0)
Lymphocytes Relative: 34 % (ref 12–46)
Lymphs Abs: 3 10*3/uL (ref 0.7–4.0)
MCV: 85.1 fL (ref 78.0–100.0)
Monocytes Absolute: 0.7 10*3/uL (ref 0.1–1.0)
Platelets: 295 10*3/uL (ref 150–400)
RBC: 5.38 MIL/uL — ABNORMAL HIGH (ref 3.87–5.11)
WBC: 8.8 10*3/uL (ref 4.0–10.5)

## 2012-11-16 LAB — URINE MICROSCOPIC-ADD ON

## 2012-11-16 MED ORDER — HYDROMORPHONE HCL PF 1 MG/ML IJ SOLN
1.0000 mg | Freq: Once | INTRAMUSCULAR | Status: AC
  Administered 2012-11-16: 1 mg via INTRAVENOUS

## 2012-11-16 MED ORDER — SODIUM CHLORIDE 0.9 % IV SOLN
Freq: Once | INTRAVENOUS | Status: AC
  Administered 2012-11-16: 23:00:00 via INTRAVENOUS

## 2012-11-16 MED ORDER — FENTANYL CITRATE 0.05 MG/ML IJ SOLN
50.0000 ug | Freq: Once | INTRAMUSCULAR | Status: AC
  Administered 2012-11-17: 50 ug via INTRAVENOUS
  Filled 2012-11-16 (×2): qty 2

## 2012-11-16 MED ORDER — TETANUS-DIPHTH-ACELL PERTUSSIS 5-2.5-18.5 LF-MCG/0.5 IM SUSP
0.5000 mL | Freq: Once | INTRAMUSCULAR | Status: AC
  Administered 2012-11-16: 0.5 mL via INTRAMUSCULAR
  Filled 2012-11-16: qty 0.5

## 2012-11-16 MED ORDER — HYDROMORPHONE HCL PF 1 MG/ML IJ SOLN
INTRAMUSCULAR | Status: AC
  Filled 2012-11-16: qty 1
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
## 2012-11-16 NOTE — ED Notes (Signed)
The pts boyfriend is  At the bedside
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
## 2012-11-17 ENCOUNTER — Emergency Department (HOSPITAL_COMMUNITY): Payer: No Typology Code available for payment source

## 2012-11-17 ENCOUNTER — Encounter (HOSPITAL_COMMUNITY): Payer: Self-pay | Admitting: Radiology

## 2012-11-17 LAB — TYPE AND SCREEN: ABO/RH(D): O POS

## 2012-11-17 MED ORDER — IOHEXOL 300 MG/ML  SOLN
100.0000 mL | Freq: Once | INTRAMUSCULAR | Status: AC | PRN
  Administered 2012-11-17: 100 mL via INTRAVENOUS

## 2012-11-17 MED ORDER — HYDROCODONE-ACETAMINOPHEN 5-325 MG PO TABS: 2.0000 | ORAL_TABLET | ORAL | Status: DC | PRN

## 2012-11-17 MED ORDER — HYDROMORPHONE HCL PF 1 MG/ML IJ SOLN
1.0000 mg | Freq: Once | INTRAMUSCULAR | Status: AC
  Administered 2012-11-17: 1 mg via INTRAVENOUS
  Filled 2012-11-17: qty 1

## 2012-11-17 MED ORDER — NITROFURANTOIN MONOHYD MACRO 100 MG PO CAPS: 100.0000 mg | ORAL_CAPSULE | Freq: Two times a day (BID) | ORAL | Status: DC
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
## 2012-11-17 NOTE — ED Notes (Signed)
Patient back from CT.

## 2012-11-17 NOTE — ED Notes (Signed)
Pt back from CT.  Resumed care of pt.

## 2012-11-17 NOTE — ED Provider Notes (Signed)
LACERATION REPAIR Performed by: Otilio Miu Authorized by: Ruby Cola E Consent: Verbal consent obtained. Risks and benefits: risks, benefits and alternatives were discussed Consent given by: patient Patient identity confirmed: provided demographic data Prepped and Draped in normal sterile fashion Wound explored  Laceration Location: chin  Laceration Length: 2cm  No Foreign Bodies seen or palpated  Anesthesia: local infiltration  Local anesthetic: lidocaine 2% w/ epinephrine  Anesthetic total: 6 ml  Irrigation method: syringe Amount of cleaning: standard  Skin closure: prolene 5.0  Number of sutures: 5  Technique: simple interrupted  Patient tolerance: Patient tolerated the procedure well with no immediate complications.   Pt aware of all imaging results.  She is complaining of pain in left suprapubic region w/ radiation to back x 3-4 days.  No associated GU sx.  U/A shows possible infection so will treat as this is a possible cause of her pain.  Ortho tech placed in right ASO and provided her with crutches.    Otilio Miu, PA-C 11/17/12 [phone]

## 2012-11-17 NOTE — ED Provider Notes (Signed)
Medical screening examination/treatment/procedure(s) were conducted as a shared visit with non-physician practitioner(s) and myself.  I personally evaluated the patient during the encounter  Doug Sou, MD 11/17/12 [phone]

## 2012-11-17 NOTE — ED Notes (Signed)
Resumed care of patient; patient currently in CT.  Will continue to monitor.

## 2012-11-19 LAB — URINE CULTURE: Colony Count: 6000

## 2012-11-20 ENCOUNTER — Emergency Department (INDEPENDENT_AMBULATORY_CARE_PROVIDER_SITE_OTHER): Payer: Medicaid Other

## 2012-11-20 ENCOUNTER — Encounter (HOSPITAL_COMMUNITY): Payer: Self-pay | Admitting: Emergency Medicine

## 2012-11-20 ENCOUNTER — Emergency Department (INDEPENDENT_AMBULATORY_CARE_PROVIDER_SITE_OTHER)
Admission: EM | Admit: 2012-11-20 | Discharge: 2012-11-20 | Disposition: A | Discharge status: Home / Self Care | Payer: Medicaid Other | Source: Home / Self Care | Attending: Emergency Medicine | Admitting: Emergency Medicine

## 2012-11-20 DIAGNOSIS — S46011A Strain of muscle(s) and tendon(s) of the rotator cuff of right shoulder, initial encounter: Secondary | ICD-10-CM

## 2012-11-20 DIAGNOSIS — S43429A Sprain of unspecified rotator cuff capsule, initial encounter: Secondary | ICD-10-CM

## 2012-11-20 LAB — POCT URINALYSIS DIP (DEVICE)
Bilirubin Urine: NEGATIVE
Glucose, UA: NEGATIVE mg/dL
Ketones, ur: NEGATIVE mg/dL
Specific Gravity, Urine: 1.02 (ref 1.005–1.030)
Urobilinogen, UA: 0.2 mg/dL (ref 0.0–1.0)

## 2012-11-20 LAB — POCT PREGNANCY, URINE: Preg Test, Ur: NEGATIVE

## 2012-11-20 MED ORDER — IBUPROFEN 800 MG PO TABS
800.0000 mg | ORAL_TABLET | Freq: Once | ORAL | Status: AC
  Administered 2012-11-20: 800 mg via ORAL

## 2012-11-20 MED ORDER — IBUPROFEN 800 MG PO TABS
ORAL_TABLET | ORAL | Status: AC
  Filled 2012-11-20: qty 1

## 2012-11-20 MED ORDER — HYDROCODONE-ACETAMINOPHEN 5-325 MG PO TABS
ORAL_TABLET | ORAL | Status: AC
  Filled 2012-11-20: qty 1

## 2012-11-20 MED ORDER — HYDROCODONE-ACETAMINOPHEN 5-325 MG PO TABS: ORAL_TABLET | ORAL | Status: DC

## 2012-11-20 MED ORDER — HYDROCODONE-ACETAMINOPHEN 5-325 MG PO TABS
1.0000 | ORAL_TABLET | Freq: Once | ORAL | Status: AC
  Administered 2012-11-20: 1 via ORAL
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
## 2012-11-21 LAB — URINE CULTURE: Colony Count: NO GROWTH

## 2012-11-24 ENCOUNTER — Emergency Department (HOSPITAL_COMMUNITY)
Admission: EM | Admit: 2012-11-24 | Discharge: 2012-11-24 | Disposition: A | Discharge status: Home / Self Care | Payer: Medicaid Other | Source: Home / Self Care

## 2012-11-26 ENCOUNTER — Other Ambulatory Visit (HOSPITAL_COMMUNITY)
Admission: RE | Admit: 2012-11-26 | Discharge: 2012-11-26 | Disposition: A | Discharge status: Home / Self Care | Payer: Medicaid Other | Source: Ambulatory Visit | Attending: Family Medicine | Admitting: Family Medicine

## 2012-11-26 ENCOUNTER — Encounter (HOSPITAL_COMMUNITY): Payer: Self-pay

## 2012-11-26 ENCOUNTER — Emergency Department (INDEPENDENT_AMBULATORY_CARE_PROVIDER_SITE_OTHER)
Admission: EM | Admit: 2012-11-26 | Discharge: 2012-11-26 | Disposition: A | Discharge status: Home / Self Care | Payer: Medicaid Other | Source: Home / Self Care | Attending: Family Medicine | Admitting: Family Medicine

## 2012-11-26 DIAGNOSIS — K5900 Constipation, unspecified: Secondary | ICD-10-CM

## 2012-11-26 DIAGNOSIS — L0390 Cellulitis, unspecified: Secondary | ICD-10-CM

## 2012-11-26 DIAGNOSIS — S43429A Sprain of unspecified rotator cuff capsule, initial encounter: Secondary | ICD-10-CM

## 2012-11-26 DIAGNOSIS — N898 Other specified noninflammatory disorders of vagina: Secondary | ICD-10-CM

## 2012-11-26 DIAGNOSIS — N760 Acute vaginitis: Secondary | ICD-10-CM | POA: Insufficient documentation

## 2012-11-26 DIAGNOSIS — Z113 Encounter for screening for infections with a predominantly sexual mode of transmission: Secondary | ICD-10-CM | POA: Insufficient documentation

## 2012-11-26 DIAGNOSIS — Z4802 Encounter for removal of sutures: Secondary | ICD-10-CM

## 2012-11-26 LAB — POCT URINALYSIS DIP (DEVICE)
Bilirubin Urine: NEGATIVE
Nitrite: NEGATIVE
Protein, ur: NEGATIVE mg/dL
pH: 7 (ref 5.0–8.0)

## 2012-11-26 MED ORDER — MUPIROCIN 2 % EX OINT: TOPICAL_OINTMENT | Freq: Three times a day (TID) | CUTANEOUS | Status: DC

## 2012-11-26 MED ORDER — CEPHALEXIN 500 MG PO CAPS: 500.0000 mg | ORAL_CAPSULE | Freq: Four times a day (QID) | ORAL | Status: DC
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
## 2012-11-27 NOTE — ED Provider Notes (Signed)
Medical screening examination/treatment/procedure(s) were performed by non-physician practitioner and as supervising physician I was immediately available for consultation/collaboration.   [facility]; MD  Sharin Grave, MD 11/27/12 1020

## 2012-11-30 ENCOUNTER — Telehealth (HOSPITAL_COMMUNITY): Payer: Self-pay | Admitting: *Deleted
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
## 2012-11-30 NOTE — ED Notes (Signed)
GC and Chlamydia pos., Affirm: Candida, Gardnerella, and Trich all neg.  I called pt. Pt. verified x 2 and given results.  Pt. told her she needed to come back to [facility] to be treated for both. Pt. said she can come tomorrow.  Kristy Howard 11/30/2012

## 2012-12-04 ENCOUNTER — Emergency Department (INDEPENDENT_AMBULATORY_CARE_PROVIDER_SITE_OTHER)
Admission: EM | Admit: 2012-12-04 | Discharge: 2012-12-04 | Disposition: A | Discharge status: Home / Self Care | Payer: Medicaid Other | Source: Home / Self Care | Attending: Emergency Medicine | Admitting: Emergency Medicine

## 2012-12-04 ENCOUNTER — Encounter (HOSPITAL_COMMUNITY): Payer: Self-pay | Admitting: Emergency Medicine

## 2012-12-04 DIAGNOSIS — A549 Gonococcal infection, unspecified: Secondary | ICD-10-CM

## 2012-12-04 DIAGNOSIS — N730 Acute parametritis and pelvic cellulitis: Secondary | ICD-10-CM

## 2012-12-04 DIAGNOSIS — A749 Chlamydial infection, unspecified: Secondary | ICD-10-CM

## 2012-12-04 MED ORDER — AZITHROMYCIN 250 MG PO TABS
1000.0000 mg | ORAL_TABLET | Freq: Once | ORAL | Status: AC
  Administered 2012-12-04: 1000 mg via ORAL

## 2012-12-04 MED ORDER — CEFTRIAXONE SODIUM 1 G IJ SOLR
1.0000 g | Freq: Once | INTRAMUSCULAR | Status: AC
  Administered 2012-12-04: 1 g via INTRAMUSCULAR

## 2012-12-04 MED ORDER — CEFTRIAXONE SODIUM 250 MG IJ SOLR: 250.0000 mg | Freq: Once | INTRAMUSCULAR | Status: DC

## 2012-12-04 MED ORDER — METRONIDAZOLE 500 MG PO TABS: 500.0000 mg | ORAL_TABLET | Freq: Three times a day (TID) | ORAL | Status: DC

## 2012-12-04 MED ORDER — OXYCODONE-ACETAMINOPHEN 5-325 MG PO TABS: ORAL_TABLET | ORAL | Status: DC

## 2012-12-04 NOTE — ED Notes (Signed)
[facility] forms x 2 completed and faxed to the [facility]. Vassie Moselle 12/04/2012

## 2012-12-04 NOTE — ED Notes (Signed)
Pt stated she could not wait the after receiving Rocephin since her boyfriend who was w/pt [facility].  Stated that she has rec'd this inj in the past and will be fine.  MD made aware.

## 2012-12-04 NOTE — ED Notes (Signed)
Pt is here to get treated for pos labs done recently Denies any new medical prob.   She is alert and oriented w/no signs of acute distress.
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
## 2012-12-06 ENCOUNTER — Inpatient Hospital Stay (HOSPITAL_COMMUNITY)
Admission: AD | Admit: 2012-12-06 | Discharge: 2012-12-06 | Disposition: A | Discharge status: Home / Self Care | Payer: Medicaid Other | Source: Ambulatory Visit | Attending: Family Medicine | Admitting: Family Medicine

## 2012-12-06 ENCOUNTER — Encounter (HOSPITAL_COMMUNITY): Payer: Self-pay | Admitting: Obstetrics and Gynecology

## 2012-12-06 DIAGNOSIS — A5611 Chlamydial female pelvic inflammatory disease: Secondary | ICD-10-CM

## 2012-12-06 DIAGNOSIS — N739 Female pelvic inflammatory disease, unspecified: Secondary | ICD-10-CM | POA: Insufficient documentation

## 2012-12-06 DIAGNOSIS — R109 Unspecified abdominal pain: Secondary | ICD-10-CM

## 2012-12-06 DIAGNOSIS — A5424 Gonococcal female pelvic inflammatory disease: Secondary | ICD-10-CM

## 2012-12-06 LAB — CBC WITH DIFFERENTIAL/PLATELET
Band Neutrophils: 0 % (ref 0–10)
Blasts: 0 %
Eosinophils Absolute: 0 10*3/uL (ref 0.0–0.7)
Eosinophils Relative: 0 % (ref 0–5)
HCT: 42.1 % (ref 36.0–46.0)
MCV: 88.1 fL (ref 78.0–100.0)
Metamyelocytes Relative: 0 %
Monocytes Absolute: 0.8 10*3/uL (ref 0.1–1.0)
Monocytes Relative: 10 % (ref 3–12)
RDW: 13.6 % (ref 11.5–15.5)
WBC: 8.3 10*3/uL (ref 4.0–10.5)

## 2012-12-06 MED ORDER — OXYCODONE-ACETAMINOPHEN 5-325 MG PO TABS: ORAL_TABLET | ORAL | Status: DC

## 2012-12-06 MED ORDER — KETOROLAC TROMETHAMINE 60 MG/2ML IM SOLN
60.0000 mg | Freq: Once | INTRAMUSCULAR | Status: AC
  Administered 2012-12-06: 60 mg via INTRAMUSCULAR
  Filled 2012-12-06: qty 2

## 2012-12-06 NOTE — MAU Provider Note (Signed)
Chart reviewed and agree with management and plan.  

## 2012-12-06 NOTE — MAU Provider Note (Signed)
History     CSN: [id number]  Arrival date and time: 12/06/12 1316   First Provider Initiated Contact with Patient 12/06/12 1404      Chief Complaint  Patient presents with  . Abdominal Pain   HPI Kristy Howard is 27 y.o. [phone] presents with right lower quadrant pain that began 2 weeks ago.  Rates as 9/10.  + GC and + Chlamydia.  She was seen at Urgent Care on 4/3 and evaluated for same sxs-dx with PID and treated with Rocepthin, Zithromax and Flagyl.  Flagyl is causing nausea.   Denies fever.  Has not taken anything for pain today.  Vicodin and Morphine cause itching and a rash.   She reports taking Percocet 3 at a time and doesn't have anymore  Needs a few more.    Past Medical History  Diagnosis Date  . Fibromyalgia   . Arthritis   . Asthma   . Neck pain   . Back pain   . Anxiety   . Thyroid disease   . Depression   . Attention deficit disorder (ADD)     History reviewed. No pertinent past surgical history.  Family History  Problem Relation Age of Onset  . Hyperlipidemia Mother   . Hypertension Mother     History  Substance Use Topics  . Smoking status: Current Every Day Smoker -- 0.50 packs/day    Types: Cigarettes  . Smokeless tobacco: Not on file  . Alcohol Use: No    Allergies:  Allergies  Allergen Reactions  . Vicodin (Hydrocodone-Acetaminophen) Hives    Prescriptions prior to admission  Medication Sig Dispense Refill  . albuterol (PROVENTIL HFA;VENTOLIN HFA) 108 (90 BASE) MCG/ACT inhaler Inhale 1-2 puffs into the lungs every 6 (six) hours as needed for wheezing.  1 Inhaler  0  . ALPRAZolam (XANAX) 1 MG tablet Take 1 mg by mouth 3 (three) times daily as needed for anxiety.      . cephALEXin (KEFLEX) 500 MG capsule Take 500 mg by mouth 4 (four) times daily. X 7 days, not yet completed      . gabapentin (NEURONTIN) 300 MG capsule Take 300 mg by mouth 3 (three) times daily.      . methylphenidate (RITALIN) 20 MG tablet Take 20 mg by mouth 2 (two)  times daily.       . metroNIDAZOLE (FLAGYL) 500 MG tablet Take 500 mg by mouth 3 (three) times daily. X 10 days, not yet completed      . mupirocin ointment (BACTROBAN) 2 % Apply topically 3 (three) times daily. To chin wound  22 g  0  . oxyCODONE-acetaminophen (PERCOCET/ROXICET) 5-325 MG per tablet Take 1-3 tablets by mouth every 6 (six) hours as needed for pain.      . traMADol (ULTRAM) 50 MG tablet Take 50 mg by mouth every 6 (six) hours as needed for pain.       . [DISCONTINUED] cephALEXin (KEFLEX) 500 MG capsule Take 1 capsule (500 mg total) by mouth 4 (four) times daily.  28 capsule  0  . [DISCONTINUED] metroNIDAZOLE (FLAGYL) 500 MG tablet Take 1 tablet (500 mg total) by mouth 3 (three) times daily.  30 tablet  0  . [DISCONTINUED] oxyCODONE-acetaminophen (PERCOCET) 5-325 MG per tablet 1 to 2 tablets every 6 hours as needed for pain.  20 tablet  0    ROS Physical Exam   Blood pressure 114/70, pulse 89, temperature 97.9 F (36.6 C), temperature source Oral, resp. rate 18, last menstrual  period 10/17/2012.  Physical Exam  Constitutional: She is oriented to person, place, and time. She appears well-developed and well-nourished. No distress.  HENT:  Head: Normocephalic.  Neck: Normal range of motion.  Cardiovascular: Normal rate.   Respiratory: Effort normal.  GI: Soft. She exhibits no distension and no mass. There is no tenderness. There is no rebound and no guarding.  Genitourinary: There is no tenderness, lesion or injury on the right labia. There is no tenderness, lesion or injury on the left labia. Uterus is tender (moderate). Uterus is not enlarged. Cervix exhibits no motion tenderness, no discharge and no friability. Right adnexum displays tenderness (moderate). Right adnexum displays no mass and no fullness. Left adnexum displays tenderness (moderate). Left adnexum displays no mass and no fullness. No erythema, tenderness or bleeding around the vagina. Vaginal discharge (white  discharge without odor) found.  Neurological: She is alert and oriented to person, place, and time.  Skin: Skin is warm and dry.  Psychiatric: She has a normal mood and affect. Her behavior is normal.   Results for orders placed during the hospital encounter of 12/06/12 (from the past 24 hour(s))  POCT PREGNANCY, URINE     Status: None   Collection Time    12/06/12  1:38 PM      Result Value Range   Preg Test, Ur NEGATIVE  NEGATIVE  CBC WITH DIFFERENTIAL     Status: None   Collection Time    12/06/12  2:34 PM      Result Value Range   WBC 8.3  4.0 - 10.5 K/uL   RBC 4.78  3.87 - 5.11 MIL/uL   Hemoglobin 14.3  12.0 - 15.0 g/dL   HCT 98.1  19.1 - 47.8 %   MCV 88.1  78.0 - 100.0 fL   MCH 29.9  26.0 - 34.0 pg   MCHC 34.0  30.0 - 36.0 g/dL   RDW 29.5  62.1 - 30.8 %   Platelets 338  150 - 400 K/uL   Neutrophils Relative PENDING  43 - 77 %   Neutro Abs PENDING  1.7 - 7.7 K/uL   Band Neutrophils PENDING  0 - 10 %   Lymphocytes Relative PENDING  12 - 46 %   Lymphs Abs PENDING  0.7 - 4.0 K/uL   Monocytes Relative PENDING  3 - 12 %   Monocytes Absolute PENDING  0.1 - 1.0 K/uL   Eosinophils Relative PENDING  0 - 5 %   Eosinophils Absolute PENDING  0.0 - 0.7 K/uL   Basophils Relative PENDING  0 - 1 %   Basophils Absolute PENDING  0.0 - 0.1 K/uL   LUCs, % PENDING  0 - 4 %   LUC, Absolute PENDING  0.0 - 0.5 K/uL   WBC Morphology PENDING     RBC Morphology PENDING     Smear Review PENDING     Other PENDING     Other 2 PENDING     nRBC PENDING  0 /100 WBC   Metamyelocytes Relative PENDING     Myelocytes PENDING     Promyelocytes Absolute PENDING     Blasts PENDING     MAU Course  Procedures  Cultures were positive for GC/CHL  MDM Discussed at length with the patient, the lab and exam results.  She was instructed not to take 3 percocet tabs at one time and to take a prescribed and that she can add Advil to her pain treatment.  She understand she is not  to have intercourse until her  partner is treated--he has appt for Monday.    * discussed with the patient the reported allergy to VICODIN.  She was given Percocet 5/325 at [facility] and took 3 at a time without any allergic reaction.   Assessment and Plan  A:  PID-treated      Abdominal pain  P:  PID adequately treated at [facility].       Rx for Percocet #20    Follow up with MD of her choice or return to MAU for worsening sxs  KEY,EVE M 12/06/2012, 2:12 PM

## 2012-12-06 NOTE — MAU Note (Signed)
Kristy Howard is here today with complaints of abdominal pain. She was seen at Urgent Care on 12/04/12 and was diagnosed with PID, Chlamydia and Gonorrhea. Her pain continues and she is concerned that she may have a cyst. Her partner is with her and has not been treated yet,  however plans to be treated.  Last pain medication taken was yesterday.

## 2012-12-21 ENCOUNTER — Emergency Department (HOSPITAL_COMMUNITY)
Admission: EM | Admit: 2012-12-21 | Discharge: 2012-12-21 | Disposition: A | Discharge status: Home / Self Care | Payer: Medicaid Other | Attending: Emergency Medicine | Admitting: Emergency Medicine

## 2012-12-21 ENCOUNTER — Encounter (HOSPITAL_COMMUNITY): Payer: Self-pay | Admitting: *Deleted

## 2012-12-21 ENCOUNTER — Emergency Department (HOSPITAL_COMMUNITY): Payer: Medicaid Other

## 2012-12-21 DIAGNOSIS — S6980XA Other specified injuries of unspecified wrist, hand and finger(s), initial encounter: Secondary | ICD-10-CM | POA: Insufficient documentation

## 2012-12-21 DIAGNOSIS — Z79899 Other long term (current) drug therapy: Secondary | ICD-10-CM | POA: Insufficient documentation

## 2012-12-21 DIAGNOSIS — IMO0002 Reserved for concepts with insufficient information to code with codable children: Secondary | ICD-10-CM | POA: Insufficient documentation

## 2012-12-21 DIAGNOSIS — F17200 Nicotine dependence, unspecified, uncomplicated: Secondary | ICD-10-CM | POA: Insufficient documentation

## 2012-12-21 DIAGNOSIS — Z862 Personal history of diseases of the blood and blood-forming organs and certain disorders involving the immune mechanism: Secondary | ICD-10-CM | POA: Insufficient documentation

## 2012-12-21 DIAGNOSIS — F988 Other specified behavioral and emotional disorders with onset usually occurring in childhood and adolescence: Secondary | ICD-10-CM | POA: Insufficient documentation

## 2012-12-21 DIAGNOSIS — J45909 Unspecified asthma, uncomplicated: Secondary | ICD-10-CM | POA: Insufficient documentation

## 2012-12-21 DIAGNOSIS — Z8739 Personal history of other diseases of the musculoskeletal system and connective tissue: Secondary | ICD-10-CM | POA: Insufficient documentation

## 2012-12-21 DIAGNOSIS — S6990XA Unspecified injury of unspecified wrist, hand and finger(s), initial encounter: Secondary | ICD-10-CM | POA: Insufficient documentation

## 2012-12-21 DIAGNOSIS — F3289 Other specified depressive episodes: Secondary | ICD-10-CM | POA: Insufficient documentation

## 2012-12-21 DIAGNOSIS — S6991XA Unspecified injury of right wrist, hand and finger(s), initial encounter: Secondary | ICD-10-CM

## 2012-12-21 DIAGNOSIS — Z8639 Personal history of other endocrine, nutritional and metabolic disease: Secondary | ICD-10-CM | POA: Insufficient documentation

## 2012-12-21 DIAGNOSIS — F329 Major depressive disorder, single episode, unspecified: Secondary | ICD-10-CM | POA: Insufficient documentation

## 2012-12-21 DIAGNOSIS — Z8659 Personal history of other mental and behavioral disorders: Secondary | ICD-10-CM | POA: Insufficient documentation
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
## 2012-12-21 NOTE — ED Notes (Signed)
Informed patient that we need a urine specimen for testing. Patient stated that she just voided and cannot provide a sample. Left specimen cup at bedside.
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
## 2012-12-22 NOTE — ED Provider Notes (Signed)
I have personally seen and examined the patient.  I have discussed the plan of care with the resident.  I have reviewed the documentation on PMH/FH/Soc. History.  I have reviewed the documentation of the resident and agree.  Doug Sou, MD 12/22/12 Moses Manners
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
## 2013-09-26 ENCOUNTER — Emergency Department (HOSPITAL_COMMUNITY)
Admission: EM | Admit: 2013-09-26 | Discharge: 2013-09-26 | Disposition: A | Discharge status: Home / Self Care | Payer: Medicaid Other | Attending: Emergency Medicine | Admitting: Emergency Medicine

## 2013-09-26 ENCOUNTER — Emergency Department (HOSPITAL_COMMUNITY): Payer: Medicaid Other

## 2013-09-26 ENCOUNTER — Encounter (HOSPITAL_COMMUNITY): Payer: Self-pay | Admitting: Emergency Medicine

## 2013-09-26 DIAGNOSIS — Y9289 Other specified places as the place of occurrence of the external cause: Secondary | ICD-10-CM | POA: Insufficient documentation

## 2013-09-26 DIAGNOSIS — E079 Disorder of thyroid, unspecified: Secondary | ICD-10-CM | POA: Insufficient documentation

## 2013-09-26 DIAGNOSIS — F411 Generalized anxiety disorder: Secondary | ICD-10-CM | POA: Insufficient documentation

## 2013-09-26 DIAGNOSIS — M542 Cervicalgia: Secondary | ICD-10-CM | POA: Insufficient documentation

## 2013-09-26 DIAGNOSIS — Z79899 Other long term (current) drug therapy: Secondary | ICD-10-CM | POA: Insufficient documentation

## 2013-09-26 DIAGNOSIS — W010XXA Fall on same level from slipping, tripping and stumbling without subsequent striking against object, initial encounter: Secondary | ICD-10-CM | POA: Insufficient documentation

## 2013-09-26 DIAGNOSIS — M549 Dorsalgia, unspecified: Secondary | ICD-10-CM | POA: Insufficient documentation

## 2013-09-26 DIAGNOSIS — S63509A Unspecified sprain of unspecified wrist, initial encounter: Secondary | ICD-10-CM | POA: Insufficient documentation

## 2013-09-26 DIAGNOSIS — R209 Unspecified disturbances of skin sensation: Secondary | ICD-10-CM | POA: Insufficient documentation

## 2013-09-26 DIAGNOSIS — M129 Arthropathy, unspecified: Secondary | ICD-10-CM | POA: Insufficient documentation

## 2013-09-26 DIAGNOSIS — S63501A Unspecified sprain of right wrist, initial encounter: Secondary | ICD-10-CM

## 2013-09-26 DIAGNOSIS — M7989 Other specified soft tissue disorders: Secondary | ICD-10-CM | POA: Insufficient documentation

## 2013-09-26 DIAGNOSIS — F988 Other specified behavioral and emotional disorders with onset usually occurring in childhood and adolescence: Secondary | ICD-10-CM | POA: Insufficient documentation

## 2013-09-26 DIAGNOSIS — J45909 Unspecified asthma, uncomplicated: Secondary | ICD-10-CM | POA: Insufficient documentation

## 2013-09-26 DIAGNOSIS — Y9379 Activity, other specified sports and athletics: Secondary | ICD-10-CM | POA: Insufficient documentation

## 2013-09-26 DIAGNOSIS — IMO0001 Reserved for inherently not codable concepts without codable children: Secondary | ICD-10-CM | POA: Insufficient documentation

## 2013-09-26 DIAGNOSIS — F17200 Nicotine dependence, unspecified, uncomplicated: Secondary | ICD-10-CM | POA: Insufficient documentation

## 2013-09-26 DIAGNOSIS — F3289 Other specified depressive episodes: Secondary | ICD-10-CM | POA: Insufficient documentation

## 2013-09-26 DIAGNOSIS — F329 Major depressive disorder, single episode, unspecified: Secondary | ICD-10-CM | POA: Insufficient documentation

## 2013-09-26 MED ORDER — IBUPROFEN 400 MG PO TABS
600.0000 mg | ORAL_TABLET | Freq: Once | ORAL | Status: AC
  Administered 2013-09-26: 600 mg via ORAL
  Filled 2013-09-26 (×2): qty 1
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
## 2013-09-26 NOTE — ED Provider Notes (Signed)
CSN: [id number]     Arrival date & time 09/26/13  0014 History   First MD Initiated Contact with Patient 09/26/13 0033     Chief Complaint  Patient presents with  . Wrist Pain   (Consider location/radiation/quality/duration/timing/severity/associated sxs/prior Treatment) HPI Comments: Patient states she was roller skating when she fell .  She is unsure exactly how she landed as her boyfriend helped her up immediately, but she's having right without deformity.  Denies previous injury to this area.  She has not taken any thing for pain, and she came directly  from [place]  Patient is a 28 y.o. female presenting with wrist pain. The history is provided by the patient.  Wrist Pain This is a new problem. The problem occurs constantly. The problem has been unchanged. Associated symptoms include joint swelling. Pertinent negatives include no fever, headaches, numbness or weakness. The symptoms are aggravated by exertion. She has tried nothing for the symptoms. The treatment provided no relief.    Past Medical History  Diagnosis Date  . Fibromyalgia   . Arthritis   . Asthma   . Neck pain   . Back pain   . Anxiety   . Thyroid disease   . Depression   . Attention deficit disorder (ADD)    History reviewed. No pertinent past surgical history. Family History  Problem Relation Age of Onset  . Hyperlipidemia Mother   . Hypertension Mother    History  Substance Use Topics  . Smoking status: Current Every Day Smoker -- 0.50 packs/day    Types: Cigarettes  . Smokeless tobacco: Not on file  . Alcohol Use: No   OB History   Grav Para Term Preterm Abortions TAB SAB Ect Mult Living   4 1   3 1 2   1      Review of Systems  Constitutional: Negative for fever.  Musculoskeletal: Positive for joint swelling.  Skin: Negative for wound.  Neurological: Negative for dizziness, weakness, numbness and headaches.  All other systems reviewed and are negative.    Allergies  Morphine and related  and Vicodin  Home Medications   Current Outpatient Rx  Name  Route  Sig  Dispense  Refill  . albuterol (PROVENTIL HFA;VENTOLIN HFA) 108 (90 BASE) MCG/ACT inhaler   Inhalation   Inhale 1-2 puffs into the lungs every 6 (six) hours as needed for wheezing.   1 Inhaler   0   . ALPRAZolam (XANAX) 1 MG tablet   Oral   Take 1 mg by mouth 2 (two) times daily as needed for anxiety.          . ARIPiprazole (ABILIFY) 5 MG tablet   Oral   Take 5 mg by mouth daily.         Marland Kitchen FLUoxetine (PROZAC) 20 MG capsule   Oral   Take 20 mg by mouth daily.         Marland Kitchen gabapentin (NEURONTIN) 300 MG capsule   Oral   Take 300 mg by mouth 4 (four) times daily.          . methylphenidate (RITALIN) 20 MG tablet   Oral   Take 20 mg by mouth 2 (two) times daily.           BP 123/73  Pulse 94  Temp(Src) 97.2 F (36.2 C) (Oral)  Resp 16  SpO2 99%  LMP 09/14/2013 Physical Exam  Nursing note and vitals reviewed. Constitutional: She is oriented to person, place, and time. She appears well-developed and  well-nourished.  Eyes: Pupils are equal, round, and reactive to light.  Neck: Normal range of motion.  Cardiovascular: Normal rate.   Musculoskeletal: She exhibits tenderness. She exhibits no edema.       Arms: Neurological: She is alert and oriented to person, place, and time.  Skin: Skin is warm. No erythema.    ED Course  Procedures (including critical care time) Labs Review Labs Reviewed - No data to display Imaging Review Dg Wrist Complete Right  09/26/2013   CLINICAL DATA:  Fall  EXAM: RIGHT WRIST - COMPLETE 3+ VIEW  COMPARISON:  None.  FINDINGS: There is no evidence of fracture or dislocation. There is no evidence of arthropathy or other focal bone abnormality. Soft tissues are unremarkable.  IMPRESSION: Negative.   Electronically Signed   By: Signa Kellaylor  Stroud M.D.   On: 09/26/2013 01:23    EKG Interpretation   None       MDM   1. Right wrist sprain     Fracture seen.   Patient has been, placed in a wrist splint, informed to wear this for the next several days.  For comfort.  Ice the area, and elevate for the next 2-3, days    Arman FilterGail K Con Arganbright, NP 09/26/13 [id number]

## 2013-09-26 NOTE — ED Provider Notes (Signed)
Medical screening examination/treatment/procedure(s) were performed by non-physician practitioner and as supervising physician I was immediately available for consultation/collaboration.    Leatha Rohner, MD 09/26/13 0454 

## 2013-09-26 NOTE — Discharge Instructions (Signed)
your xray is normal Please wear the splint for comfort for the next several days Tonight elevated your arm, apply ice for 20-30 minutes at a time

## 2014-07-05 ENCOUNTER — Encounter (HOSPITAL_COMMUNITY): Payer: Self-pay | Admitting: Emergency Medicine

## 2015-08-15 LAB — OB RESULTS CONSOLE PLATELET COUNT: Platelets: 254 10*3/uL

## 2015-08-15 LAB — OB RESULTS CONSOLE ABO/RH: RH TYPE: POSITIVE

## 2015-08-15 LAB — OB RESULTS CONSOLE ANTIBODY SCREEN: ANTIBODY SCREEN: NEGATIVE

## 2015-08-15 LAB — OB RESULTS CONSOLE HGB/HCT, BLOOD
HEMATOCRIT: 35 %
HEMOGLOBIN: 12.1 g/dL

## 2015-09-04 NOTE — L&D Delivery Note (Signed)
Delivery Note At 3:08 PM a viable female was delivered via  (Presentation: OP;ROT  ).  APGAR: 8,9 ; weight pending.   Placenta status:Intact.  Cord: 3 vessel, no complications. Cord pH: not sent  Anesthesia: Epidural  Episiotomy:  None Lacerations:  None Suture Repair: N/a Est. Blood Loss (mL):  100  Mom to postpartum.  Baby to Couplet care / Skin to Skin.  Olena LeatherwoodKelly M Jonta Gastineau 02/10/2016, 3:25 PM

## 2015-11-10 ENCOUNTER — Encounter: Payer: Self-pay | Admitting: *Deleted

## 2015-11-10 DIAGNOSIS — D6861 Antiphospholipid syndrome: Secondary | ICD-10-CM | POA: Insufficient documentation

## 2015-11-10 DIAGNOSIS — O99280 Endocrine, nutritional and metabolic diseases complicating pregnancy, unspecified trimester: Secondary | ICD-10-CM

## 2015-11-10 DIAGNOSIS — O0990 Supervision of high risk pregnancy, unspecified, unspecified trimester: Secondary | ICD-10-CM | POA: Insufficient documentation

## 2015-11-10 DIAGNOSIS — E039 Hypothyroidism, unspecified: Secondary | ICD-10-CM

## 2015-11-10 DIAGNOSIS — O99119 Other diseases of the blood and blood-forming organs and certain disorders involving the immune mechanism complicating pregnancy, unspecified trimester: Secondary | ICD-10-CM

## 2015-11-10 DIAGNOSIS — F419 Anxiety disorder, unspecified: Secondary | ICD-10-CM | POA: Insufficient documentation

## 2015-11-30 ENCOUNTER — Encounter: Payer: Medicaid Other | Admitting: Certified Nurse Midwife

## 2015-12-08 ENCOUNTER — Ambulatory Visit (INDEPENDENT_AMBULATORY_CARE_PROVIDER_SITE_OTHER): Payer: Self-pay | Admitting: Family

## 2015-12-08 ENCOUNTER — Encounter: Payer: Self-pay | Admitting: Family

## 2015-12-08 VITALS — BP 121/77 | HR 88 | Temp 98.2°F | Ht 66.0 in | Wt 169.1 lb

## 2015-12-08 DIAGNOSIS — O99283 Endocrine, nutritional and metabolic diseases complicating pregnancy, third trimester: Secondary | ICD-10-CM

## 2015-12-08 DIAGNOSIS — E039 Hypothyroidism, unspecified: Secondary | ICD-10-CM

## 2015-12-08 DIAGNOSIS — F1920 Other psychoactive substance dependence, uncomplicated: Secondary | ICD-10-CM

## 2015-12-08 DIAGNOSIS — O4703 False labor before 37 completed weeks of gestation, third trimester: Secondary | ICD-10-CM

## 2015-12-08 DIAGNOSIS — F1190 Opioid use, unspecified, uncomplicated: Secondary | ICD-10-CM

## 2015-12-08 DIAGNOSIS — O0993 Supervision of high risk pregnancy, unspecified, third trimester: Secondary | ICD-10-CM

## 2015-12-08 DIAGNOSIS — Z113 Encounter for screening for infections with a predominantly sexual mode of transmission: Secondary | ICD-10-CM

## 2015-12-08 DIAGNOSIS — O99323 Drug use complicating pregnancy, third trimester: Secondary | ICD-10-CM

## 2015-12-08 DIAGNOSIS — O09893 Supervision of other high risk pregnancies, third trimester: Secondary | ICD-10-CM

## 2015-12-08 DIAGNOSIS — O4700 False labor before 37 completed weeks of gestation, unspecified trimester: Secondary | ICD-10-CM | POA: Insufficient documentation

## 2015-12-08 LAB — OB RESULTS CONSOLE GBS: STREP GROUP B AG: NEGATIVE

## 2015-12-08 LAB — POCT URINALYSIS DIP (DEVICE)
Glucose, UA: NEGATIVE mg/dL
Ketones, ur: NEGATIVE mg/dL
Leukocytes, UA: NEGATIVE
Nitrite: NEGATIVE
Protein, ur: NEGATIVE mg/dL
Specific Gravity, Urine: 1.025 (ref 1.005–1.030)
Urobilinogen, UA: 0.2 mg/dL (ref 0.0–1.0)
pH: 6 (ref 5.0–8.0)

## 2015-12-08 NOTE — Progress Notes (Signed)
Subjective:    Kristy Howard is a [id number] 119w2d being seen today for her first obstetrical visit.  [place] in [place].  Received prenatal care at two separate offices in [place].  [place] lived in [place], decided to move back.  Her obstetrical history is significant for opioid dependence,  antiphospholipid antibody syndrome, multiple miscarriages, and thretened preterm labor this pregnancy.. Patient does intend to breast feed. Pregnancy history fully reviewed.  Patient reports no complaints.  Filed Vitals:   12/08/15 1016 12/08/15 1018  BP: 121/77   Pulse: 88   Temp: 98.2 F (36.8 C)   Height:  5\' 6"  (1.676 m)  Weight: 169 lb 1.6 oz (76.703 kg)     HISTORY: OB History  Gravida Para Term Preterm AB SAB TAB Ectopic Multiple Living  5 1   3 2 1   1     # Outcome Date GA Lbr Len/2nd Weight Sex Delivery Anes PTL Lv  5 Current           4 TAB           3 SAB           2 SAB           1 Para    7 lb 8 oz (3.402 kg) F Vag-Spont  N      Past Medical History  Diagnosis Date  . Fibromyalgia   . Arthritis   . Asthma   . Neck pain   . Back pain   . Anxiety   . Thyroid disease   . Depression   . Attention deficit disorder (ADD)   . Hypothyroidism    History reviewed. No pertinent past surgical history. Family History  Problem Relation Age of Onset  . Hyperlipidemia Mother   . Hypertension Mother      Exam   Filed Vitals:   12/08/15 1016 12/08/15 1018  BP: 121/77   Pulse: 88   Temp: 98.2 F (36.8 C)   Height:  5\' 6"  (1.676 m)  Weight: 169 lb 1.6 oz (76.703 kg)     Fetal Status: Fetal Heart Rate (bpm): 124 Fundal Height: 33 cm Movement: Present     General:  Alert, oriented and cooperative. Patient is in no acute distress.  Skin: Skin is warm and dry. No rash noted.   Cardiovascular: Normal heart rate noted  Respiratory: Normal respiratory effort, no problems with respiration noted  Abdomen: Soft, gravid, appropriate  for gestational age. Pain/Pressure: Present     Pelvic:       Cervical exam deferred        Extremities: Normal range of motion.  Edema: None  Mental Status: Normal mood and affect. Normal behavior. Normal judgment and thought content.   Urinalysis:         Assessment:    Pregnancy: [id number] Patient Active Problem List   Diagnosis Date Noted  . Threatened premature labor, antepartum 12/08/2015  . Supervision of high-risk pregnancy 11/10/2015  . Hypothyroidism in pregnancy 11/10/2015  . Anxiety 11/10/2015  . Phospholipid antibody syndrome affecting pregnancy (HCC) 11/10/2015        Plan:     Reviewed OB records - obtain initial prenatal records from provider seen in first trimester - [place].  Pt needs 1 hour; will come for lab only visit for third trimester labs and new OB labs. Prenatal vitamins. Problem list reviewed and updated.  Follow up in 2 weeks.  Marlis EdelsonKARIM, WALIDAH N 12/08/2015

## 2015-12-09 ENCOUNTER — Other Ambulatory Visit: Payer: Self-pay

## 2015-12-09 DIAGNOSIS — O99320 Drug use complicating pregnancy, unspecified trimester: Secondary | ICD-10-CM

## 2015-12-09 DIAGNOSIS — O0993 Supervision of high risk pregnancy, unspecified, third trimester: Secondary | ICD-10-CM

## 2015-12-09 DIAGNOSIS — F1920 Other psychoactive substance dependence, uncomplicated: Secondary | ICD-10-CM | POA: Insufficient documentation

## 2015-12-09 LAB — CULTURE, OB URINE
Colony Count: NO GROWTH
ORGANISM ID, BACTERIA: NO GROWTH

## 2015-12-10 LAB — GLUCOSE TOLERANCE, 1 HOUR (50G) W/O FASTING: GLUCOSE, 1 HR, GESTATIONAL: 92 mg/dL (ref <140)

## 2015-12-12 ENCOUNTER — Encounter: Payer: Self-pay | Admitting: Family Medicine

## 2015-12-12 DIAGNOSIS — Z283 Underimmunization status: Secondary | ICD-10-CM | POA: Insufficient documentation

## 2015-12-12 DIAGNOSIS — O09899 Supervision of other high risk pregnancies, unspecified trimester: Secondary | ICD-10-CM | POA: Insufficient documentation

## 2015-12-12 DIAGNOSIS — O9989 Other specified diseases and conditions complicating pregnancy, childbirth and the puerperium: Secondary | ICD-10-CM

## 2015-12-12 LAB — PRENATAL PROFILE (SOLSTAS)
Antibody Screen: NEGATIVE
BASOS ABS: 0 {cells}/uL (ref 0–200)
Basophils Relative: 0 %
EOS ABS: 77 {cells}/uL (ref 15–500)
EOS PCT: 1 %
HCT: 32.3 % — ABNORMAL LOW (ref 35.0–45.0)
HEMOGLOBIN: 10.2 g/dL — AB (ref 11.7–15.5)
HEP B S AG: NEGATIVE
HIV 1&2 Ab, 4th Generation: NONREACTIVE
Lymphocytes Relative: 19 %
Lymphs Abs: 1463 cells/uL (ref 850–3900)
MCH: 25.2 pg — AB (ref 27.0–33.0)
MCHC: 31.6 g/dL — AB (ref 32.0–36.0)
MCV: 80 fL (ref 80.0–100.0)
MONOS PCT: 9 %
MPV: 8.7 fL (ref 7.5–12.5)
Monocytes Absolute: 693 cells/uL (ref 200–950)
NEUTROS ABS: 5467 {cells}/uL (ref 1500–7800)
NEUTROS PCT: 71 %
PLATELETS: 318 10*3/uL (ref 140–400)
RBC: 4.04 MIL/uL (ref 3.80–5.10)
RDW: 15.3 % — ABNORMAL HIGH (ref 11.0–15.0)
Rh Type: POSITIVE
WBC: 7.7 10*3/uL (ref 3.8–10.8)

## 2015-12-12 LAB — GC/CHLAMYDIA PROBE AMP (~~LOC~~) NOT AT ARMC
Chlamydia: NEGATIVE
NEISSERIA GONORRHEA: NEGATIVE

## 2015-12-14 LAB — BENZODIAZEPINES (GC/LC/MS), URINE
Alprazolam metabolite (GC/LC/MS), ur confirm: NEGATIVE ng/mL (ref <25)
Clonazepam metabolite (GC/LC/MS), ur confirm: NEGATIVE ng/mL (ref <25)
FLURAZEPAMU: NEGATIVE ng/mL (ref <50)
LORAZEPAMU: NEGATIVE ng/mL (ref <50)
MIDAZOLAMU: NEGATIVE ng/mL (ref <50)
NORDIAZEPAMU: NEGATIVE ng/mL (ref <50)
Oxazepam (GC/LC/MS), ur confirm: NEGATIVE ng/mL (ref <50)
TEMAZEPAMU: NEGATIVE ng/mL (ref <50)
Triazolam metabolite (GC/LC/MS), ur confirm: NEGATIVE ng/mL (ref <50)

## 2015-12-14 LAB — BUPRENORPHINES (GC/LC/MS), URINE
BUPRENORPHINE (GC/LC/MS): 196.8 ng/mL — AB (ref <2)
Norbuprenorphine: 721.2 ng/mL — AB (ref <2)

## 2015-12-14 LAB — CANNABANOIDS (GC/LC/MS), URINE: THC-COOH (GC/LC/MS), ur confirm: 147 ng/mL — AB (ref <5)

## 2015-12-14 LAB — COCAINE METABOLITE (GC/LC/MS), URINE: BENZOYLECGONINE GC/MS CONF: 6076 ng/mL — AB (ref <100)

## 2015-12-15 LAB — PRESCRIPTION MONITORING PROFILE (19 PANEL)
AMPHETAMINE/METH: NEGATIVE ng/mL
BARBITURATE SCREEN, URINE: NEGATIVE ng/mL
Carisoprodol, Urine: NEGATIVE ng/mL
Creatinine, Urine: 178.74 mg/dL (ref >20.0)
Fentanyl, Ur: NEGATIVE ng/mL
MDMA URINE: NEGATIVE ng/mL
METHADONE SCREEN, URINE: NEGATIVE ng/mL
METHAQUALONE SCREEN (URINE): NEGATIVE ng/mL
Meperidine, Ur: NEGATIVE ng/mL
NITRITES URINE, INITIAL: NEGATIVE ug/mL
OPIATE SCREEN, URINE: NEGATIVE ng/mL
Oxycodone Screen, Ur: NEGATIVE ng/mL
PHENCYCLIDINE, UR: NEGATIVE ng/mL
PROPOXYPHENE: NEGATIVE ng/mL
TAPENTADOLUR: NEGATIVE ng/mL
Tramadol Scrn, Ur: NEGATIVE ng/mL
ZOLPIDEM, URINE: NEGATIVE ng/mL
pH, Initial: 6.8 pH (ref 4.5–8.9)

## 2015-12-22 ENCOUNTER — Encounter: Payer: Self-pay | Admitting: Family

## 2015-12-26 ENCOUNTER — Telehealth: Payer: Self-pay

## 2015-12-26 NOTE — Telephone Encounter (Signed)
Pt called and stated that she was suppose to get an US scheduled; she missed her last prenatal appt and wanted to know if she could US scheduled before then.  Called pt and LM that we are returning her call to please give us a call back.

## 2015-12-27 NOTE — Telephone Encounter (Signed)
Kristy CoolerChristen also called yesterday afternnon and left a message she has had a yeast infection in the past and thinks she has another. States next appointment not until 01/05/16 and wants to know if something can be called in.

## 2016-01-05 ENCOUNTER — Telehealth: Payer: Self-pay | Admitting: Family

## 2016-01-05 ENCOUNTER — Ambulatory Visit (INDEPENDENT_AMBULATORY_CARE_PROVIDER_SITE_OTHER): Payer: Medicaid Other | Admitting: Family

## 2016-01-05 VITALS — BP 117/70 | HR 82 | Wt 166.0 lb

## 2016-01-05 DIAGNOSIS — O99119 Other diseases of the blood and blood-forming organs and certain disorders involving the immune mechanism complicating pregnancy, unspecified trimester: Secondary | ICD-10-CM

## 2016-01-05 DIAGNOSIS — D6861 Antiphospholipid syndrome: Secondary | ICD-10-CM | POA: Diagnosis not present

## 2016-01-05 DIAGNOSIS — O99323 Drug use complicating pregnancy, third trimester: Secondary | ICD-10-CM

## 2016-01-05 DIAGNOSIS — O09893 Supervision of other high risk pregnancies, third trimester: Secondary | ICD-10-CM

## 2016-01-05 DIAGNOSIS — F1920 Other psychoactive substance dependence, uncomplicated: Secondary | ICD-10-CM

## 2016-01-05 DIAGNOSIS — O0993 Supervision of high risk pregnancy, unspecified, third trimester: Secondary | ICD-10-CM

## 2016-01-05 LAB — POCT URINALYSIS DIP (DEVICE)
BILIRUBIN URINE: NEGATIVE
GLUCOSE, UA: NEGATIVE mg/dL
KETONES UR: NEGATIVE mg/dL
Leukocytes, UA: NEGATIVE
Nitrite: NEGATIVE
Protein, ur: NEGATIVE mg/dL
SPECIFIC GRAVITY, URINE: 1.015 (ref 1.005–1.030)
Urobilinogen, UA: 0.2 mg/dL (ref 0.0–1.0)
pH: 7 (ref 5.0–8.0)

## 2016-01-05 MED ORDER — ENOXAPARIN SODIUM 40 MG/0.4ML ~~LOC~~ SOLN: 40.0000 mg | SUBCUTANEOUS | Status: DC

## 2016-01-05 MED ORDER — TERCONAZOLE 0.4 % VA CREA: 1.0000 | TOPICAL_CREAM | Freq: Every day | VAGINAL | Status: DC

## 2016-01-05 NOTE — Progress Notes (Signed)
Subjective:  Kristy Howard is a 30 y.o. G5P0031 at 2750w2d being seen today for ongoing prenatal care.  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  Patient reports reports increased vaginal itching.   .  .  Movement: Present. Denies leaking of fluid.   The following portions of the patient's history were reviewed and updated as appropriate: allergies, current medications, past family history, past medical history, past social history, past surgical history and problem list. Problem list updated.  Objective:   Filed Vitals:   01/05/16 1156  BP: 117/70  Pulse: 82  Weight: 166 lb (75.297 kg)    Fetal Status: Fetal Heart Rate (bpm): 135 Fundal Height: 34 cm Movement: Present     General:  Alert, oriented and cooperative. Patient is in no acute distress.  Skin: Skin is warm and dry. No rash noted.   Cardiovascular: Normal heart rate noted  Respiratory: Normal respiratory effort, no problems with respiration noted  Abdomen: Soft, gravid, appropriate for gestational age. Pain/Pressure: Present     Pelvic:       Cervical exam deferred        Extremities: Normal range of motion.  Edema: None  Mental Status: Normal mood and affect. Normal behavior. Normal judgment and thought content.   Urinalysis: Urine Protein: Negative Urine Glucose: Negative  Assessment and Plan:  Pregnancy: G5P0031 at 3550w2d  1. Supervision of other high-risk pregnancy, third trimester [O09.893] - enoxaparin (LOVENOX) 40 MG/0.4ML injection; Inject 0.4 mLs (40 mg total) into the skin daily.  Dispense: 30 Syringe; Refill: 1  2. Supervision of high-risk pregnancy, third trimester -  Continue monitoring  3. Phospholipid antibody syndrome affecting pregnancy (HCC) - Refill of Lovenox  4. 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 Please refer to After Visit Summary for other counseling recommendations.  Return in about 2 weeks (around 01/19/2016).   Eino FarberWalidah Kennith GainN Karim, CNM

## 2016-01-05 NOTE — Telephone Encounter (Signed)
Pt has been seen in our office today.

## 2016-01-05 NOTE — Progress Notes (Deleted)
LM for Heather for NICU tour

## 2016-01-05 NOTE — Telephone Encounter (Signed)
Patient called this morning to say she wasn't feeling well, and was late for her appointment. She asked if she could still come. [facility], she said she could still come. I asked the patient how long would it be before she could get here. She stated about 45 minuets to an hour. I then told patient that she could come to her visit, but she might have to wait a few minuets. Patient stated that would be fine, and she was on her way. That was around 8:50 am.

## 2016-01-05 NOTE — Progress Notes (Signed)
LM for Heather in regards to scheduling to NICU tour.

## 2016-01-05 NOTE — Progress Notes (Signed)
Pt is requesting refill on Lovenox

## 2016-01-16 ENCOUNTER — Encounter (HOSPITAL_COMMUNITY): Payer: Self-pay

## 2016-01-16 NOTE — Progress Notes (Signed)
NICU NAS tour scheduled for 01/19/16 at 2:00.

## 2016-01-19 ENCOUNTER — Encounter (HOSPITAL_COMMUNITY): Payer: Self-pay

## 2016-01-19 NOTE — Progress Notes (Unsigned)
NICU NAS tour completed by Duanne LimerickKristi Coe, NNP. Brochure given and questions answered.

## 2016-01-20 ENCOUNTER — Encounter: Payer: Medicaid Other | Admitting: Advanced Practice Midwife

## 2016-02-09 ENCOUNTER — Telehealth (HOSPITAL_COMMUNITY): Payer: Self-pay | Admitting: *Deleted

## 2016-02-09 ENCOUNTER — Other Ambulatory Visit (HOSPITAL_COMMUNITY)
Admission: RE | Admit: 2016-02-09 | Discharge: 2016-02-09 | Disposition: A | Discharge status: Home / Self Care | Payer: Medicaid Other | Source: Ambulatory Visit | Attending: Obstetrics and Gynecology | Admitting: Obstetrics and Gynecology

## 2016-02-09 ENCOUNTER — Encounter (HOSPITAL_COMMUNITY): Payer: Self-pay | Admitting: *Deleted

## 2016-02-09 ENCOUNTER — Encounter (HOSPITAL_COMMUNITY): Payer: Self-pay

## 2016-02-09 ENCOUNTER — Ambulatory Visit (INDEPENDENT_AMBULATORY_CARE_PROVIDER_SITE_OTHER): Payer: Medicaid Other | Admitting: Obstetrics and Gynecology

## 2016-02-09 ENCOUNTER — Encounter: Payer: Self-pay | Admitting: Obstetrics and Gynecology

## 2016-02-09 VITALS — BP 129/74 | HR 72 | Wt 170.0 lb

## 2016-02-09 DIAGNOSIS — O99323 Drug use complicating pregnancy, third trimester: Secondary | ICD-10-CM | POA: Diagnosis not present

## 2016-02-09 DIAGNOSIS — O99283 Endocrine, nutritional and metabolic diseases complicating pregnancy, third trimester: Secondary | ICD-10-CM | POA: Diagnosis not present

## 2016-02-09 DIAGNOSIS — D6861 Antiphospholipid syndrome: Secondary | ICD-10-CM

## 2016-02-09 DIAGNOSIS — O99119 Other diseases of the blood and blood-forming organs and certain disorders involving the immune mechanism complicating pregnancy, unspecified trimester: Secondary | ICD-10-CM | POA: Diagnosis not present

## 2016-02-09 DIAGNOSIS — E039 Hypothyroidism, unspecified: Secondary | ICD-10-CM | POA: Diagnosis not present

## 2016-02-09 DIAGNOSIS — Z113 Encounter for screening for infections with a predominantly sexual mode of transmission: Secondary | ICD-10-CM | POA: Insufficient documentation

## 2016-02-09 DIAGNOSIS — F1920 Other psychoactive substance dependence, uncomplicated: Secondary | ICD-10-CM

## 2016-02-09 DIAGNOSIS — O0993 Supervision of high risk pregnancy, unspecified, third trimester: Secondary | ICD-10-CM

## 2016-02-09 LAB — POCT URINALYSIS DIP (DEVICE)
BILIRUBIN URINE: NEGATIVE
GLUCOSE, UA: NEGATIVE mg/dL
Ketones, ur: NEGATIVE mg/dL
NITRITE: NEGATIVE
Protein, ur: NEGATIVE mg/dL
Specific Gravity, Urine: 1.02 (ref 1.005–1.030)
UROBILINOGEN UA: 1 mg/dL (ref 0.0–1.0)
pH: 7 (ref 5.0–8.0)

## 2016-02-09 NOTE — Addendum Note (Signed)
Addended by: Garret ReddishBARNES, Niomie Englert M on: 02/09/2016 03:49 PM   Modules accepted: Orders

## 2016-02-09 NOTE — Telephone Encounter (Signed)
Preadmission screen  

## 2016-02-09 NOTE — Progress Notes (Signed)
Subjective:  Kristy Howard is a 30 y.o. G5P0031 at 1525w2d being seen today for ongoing prenatal care.  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  Patient reports no complaints.   .  .  Movement: Present. Denies leaking of fluid.   The following portions of the patient's history were reviewed and updated as appropriate: allergies, current medications, past family history, past medical history, past social history, past surgical history and problem list. Problem list updated.  Objective:   Filed Vitals:   02/09/16 1122  BP: 129/74  Pulse: 72  Weight: 170 lb (77.111 kg)    Fetal Status: Fetal Heart Rate (bpm): 129 Fundal Height: 39 cm Movement: Present  Presentation: Vertex  General:  Alert, oriented and cooperative. Patient is in no acute distress.  Skin: Skin is warm and dry. No rash noted.   Cardiovascular: Normal heart rate noted  Respiratory: Normal respiratory effort, no problems with respiration noted  Abdomen: Soft, gravid, appropriate for gestational age. Pain/Pressure: Present     Pelvic:       Cervical exam performed Dilation: 2.5 Effacement (%): 80 Station: -3  Extremities: Normal range of motion.     Mental Status: Normal mood and affect. Normal behavior. Normal judgment and thought content.   Urinalysis: Urine Protein: Negative Urine Glucose: Negative  Assessment and Plan:  Pregnancy: G5P0031 at 7225w2d  1. Supervision of high-risk pregnancy, third trimester Patient is doing well without complaints Reports missing appointment because she had the flu Cultures collected today  2. Hypothyroidism in pregnancy, third trimester   3.  Phospholipid antibody syndrome affecting pregnancy [facility](HCC) Patient discontinue lovenox a few days ago due to fear of eminent delivery  Will plan for IOL today or tomorrow  4. 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 Please refer to After Visit Summary for other counseling recommendations.  No Follow-up on file.   Catalina AntiguaPeggy Hermes Wafer, MD

## 2016-02-10 ENCOUNTER — Inpatient Hospital Stay (HOSPITAL_COMMUNITY)
Admission: RE | Admit: 2016-02-10 | Discharge: 2016-02-12 | DRG: 774 | Disposition: A | Discharge status: Home / Self Care | Payer: Medicaid Other | Source: Ambulatory Visit | Attending: Obstetrics and Gynecology | Admitting: Obstetrics and Gynecology

## 2016-02-10 ENCOUNTER — Encounter (HOSPITAL_COMMUNITY): Payer: Self-pay

## 2016-02-10 ENCOUNTER — Inpatient Hospital Stay (HOSPITAL_COMMUNITY): Payer: Medicaid Other | Admitting: Anesthesiology

## 2016-02-10 VITALS — BP 113/72 | HR 68 | Temp 97.6°F | Resp 18 | Ht 66.0 in | Wt 170.0 lb

## 2016-02-10 DIAGNOSIS — O99344 Other mental disorders complicating childbirth: Secondary | ICD-10-CM | POA: Diagnosis present

## 2016-02-10 DIAGNOSIS — F418 Other specified anxiety disorders: Secondary | ICD-10-CM | POA: Diagnosis present

## 2016-02-10 DIAGNOSIS — O99323 Drug use complicating pregnancy, third trimester: Secondary | ICD-10-CM

## 2016-02-10 DIAGNOSIS — F1120 Opioid dependence, uncomplicated: Secondary | ICD-10-CM | POA: Diagnosis present

## 2016-02-10 DIAGNOSIS — O99334 Smoking (tobacco) complicating childbirth: Secondary | ICD-10-CM | POA: Diagnosis present

## 2016-02-10 DIAGNOSIS — O99119 Other diseases of the blood and blood-forming organs and certain disorders involving the immune mechanism complicating pregnancy, unspecified trimester: Secondary | ICD-10-CM

## 2016-02-10 DIAGNOSIS — Z3A39 39 weeks gestation of pregnancy: Secondary | ICD-10-CM

## 2016-02-10 DIAGNOSIS — Z8249 Family history of ischemic heart disease and other diseases of the circulatory system: Secondary | ICD-10-CM | POA: Diagnosis not present

## 2016-02-10 DIAGNOSIS — O9989 Other specified diseases and conditions complicating pregnancy, childbirth and the puerperium: Secondary | ICD-10-CM

## 2016-02-10 DIAGNOSIS — O99324 Drug use complicating childbirth: Secondary | ICD-10-CM | POA: Diagnosis present

## 2016-02-10 DIAGNOSIS — E039 Hypothyroidism, unspecified: Secondary | ICD-10-CM

## 2016-02-10 DIAGNOSIS — O9952 Diseases of the respiratory system complicating childbirth: Secondary | ICD-10-CM | POA: Diagnosis present

## 2016-02-10 DIAGNOSIS — J019 Acute sinusitis, unspecified: Secondary | ICD-10-CM | POA: Diagnosis present

## 2016-02-10 DIAGNOSIS — Z283 Underimmunization status: Secondary | ICD-10-CM

## 2016-02-10 DIAGNOSIS — O9912 Other diseases of the blood and blood-forming organs and certain disorders involving the immune mechanism complicating childbirth: Secondary | ICD-10-CM | POA: Diagnosis present

## 2016-02-10 DIAGNOSIS — O9842 Viral hepatitis complicating childbirth: Secondary | ICD-10-CM | POA: Diagnosis present

## 2016-02-10 DIAGNOSIS — B1920 Unspecified viral hepatitis C without hepatic coma: Secondary | ICD-10-CM | POA: Diagnosis present

## 2016-02-10 DIAGNOSIS — O99283 Endocrine, nutritional and metabolic diseases complicating pregnancy, third trimester: Secondary | ICD-10-CM

## 2016-02-10 DIAGNOSIS — J45909 Unspecified asthma, uncomplicated: Secondary | ICD-10-CM | POA: Diagnosis present

## 2016-02-10 DIAGNOSIS — D6861 Antiphospholipid syndrome: Secondary | ICD-10-CM | POA: Diagnosis present

## 2016-02-10 DIAGNOSIS — O0993 Supervision of high risk pregnancy, unspecified, third trimester: Secondary | ICD-10-CM

## 2016-02-10 DIAGNOSIS — R768 Other specified abnormal immunological findings in serum: Secondary | ICD-10-CM

## 2016-02-10 DIAGNOSIS — O09899 Supervision of other high risk pregnancies, unspecified trimester: Secondary | ICD-10-CM

## 2016-02-10 DIAGNOSIS — B9689 Other specified bacterial agents as the cause of diseases classified elsewhere: Secondary | ICD-10-CM | POA: Diagnosis present

## 2016-02-10 DIAGNOSIS — O99320 Drug use complicating pregnancy, unspecified trimester: Secondary | ICD-10-CM

## 2016-02-10 DIAGNOSIS — O4700 False labor before 37 completed weeks of gestation, unspecified trimester: Secondary | ICD-10-CM

## 2016-02-10 DIAGNOSIS — F17210 Nicotine dependence, cigarettes, uncomplicated: Secondary | ICD-10-CM | POA: Diagnosis present

## 2016-02-10 DIAGNOSIS — F1920 Other psychoactive substance dependence, uncomplicated: Secondary | ICD-10-CM | POA: Diagnosis present

## 2016-02-10 LAB — COMPREHENSIVE METABOLIC PANEL
ALT: 36 U/L (ref 14–54)
ANION GAP: 6 (ref 5–15)
AST: 31 U/L (ref 15–41)
Albumin: 3 g/dL — ABNORMAL LOW (ref 3.5–5.0)
Alkaline Phosphatase: 165 U/L — ABNORMAL HIGH (ref 38–126)
BUN: 8 mg/dL (ref 6–20)
CALCIUM: 9.1 mg/dL (ref 8.9–10.3)
CHLORIDE: 104 mmol/L (ref 101–111)
CO2: 24 mmol/L (ref 22–32)
Creatinine, Ser: 0.52 mg/dL (ref 0.44–1.00)
Glucose, Bld: 103 mg/dL — ABNORMAL HIGH (ref 65–99)
Potassium: 4.1 mmol/L (ref 3.5–5.1)
SODIUM: 134 mmol/L — AB (ref 135–145)
Total Bilirubin: 0.5 mg/dL (ref 0.3–1.2)
Total Protein: 6.8 g/dL (ref 6.5–8.1)

## 2016-02-10 LAB — TYPE AND SCREEN
ABO/RH(D): O POS
Antibody Screen: NEGATIVE

## 2016-02-10 LAB — CBC
HCT: 31.2 % — ABNORMAL LOW (ref 36.0–46.0)
HEMOGLOBIN: 9.8 g/dL — AB (ref 12.0–15.0)
MCH: 22.5 pg — AB (ref 26.0–34.0)
MCHC: 31.4 g/dL (ref 30.0–36.0)
MCV: 71.7 fL — AB (ref 78.0–100.0)
Platelets: 283 10*3/uL (ref 150–400)
RBC: 4.35 MIL/uL (ref 3.87–5.11)
RDW: 17.9 % — ABNORMAL HIGH (ref 11.5–15.5)
WBC: 10.2 10*3/uL (ref 4.0–10.5)

## 2016-02-10 LAB — RAPID URINE DRUG SCREEN, HOSP PERFORMED
AMPHETAMINES: NOT DETECTED
BENZODIAZEPINES: NOT DETECTED
Barbiturates: NOT DETECTED
COCAINE: NOT DETECTED
OPIATES: NOT DETECTED
Tetrahydrocannabinol: NOT DETECTED

## 2016-02-10 LAB — GC/CHLAMYDIA PROBE AMP (~~LOC~~) NOT AT ARMC
Chlamydia: NEGATIVE
Neisseria Gonorrhea: NEGATIVE

## 2016-02-10 LAB — OB RESULTS CONSOLE GBS: GBS: NEGATIVE

## 2016-02-10 LAB — GROUP B STREP BY PCR: Group B strep by PCR: NEGATIVE

## 2016-02-10 LAB — RPR: RPR: NONREACTIVE

## 2016-02-10 MED ORDER — PRENATAL MULTIVITAMIN CH
1.0000 | ORAL_TABLET | Freq: Every day | ORAL | Status: DC
  Administered 2016-02-11: 1 via ORAL
  Filled 2016-02-10: qty 1

## 2016-02-10 MED ORDER — PHENYLEPHRINE 40 MCG/ML (10ML) SYRINGE FOR IV PUSH (FOR BLOOD PRESSURE SUPPORT)
80.0000 ug | PREFILLED_SYRINGE | INTRAVENOUS | Status: DC | PRN
  Filled 2016-02-10: qty 5
  Filled 2016-02-10: qty 10

## 2016-02-10 MED ORDER — TERBUTALINE SULFATE 1 MG/ML IJ SOLN
0.2500 mg | Freq: Once | INTRAMUSCULAR | Status: DC | PRN
  Filled 2016-02-10: qty 1

## 2016-02-10 MED ORDER — LIDOCAINE HCL (PF) 1 % IJ SOLN
30.0000 mL | INTRAMUSCULAR | Status: DC | PRN
  Filled 2016-02-10: qty 30

## 2016-02-10 MED ORDER — SENNOSIDES-DOCUSATE SODIUM 8.6-50 MG PO TABS
2.0000 | ORAL_TABLET | ORAL | Status: DC
  Administered 2016-02-12: 2 via ORAL
  Filled 2016-02-10: qty 2

## 2016-02-10 MED ORDER — FLUTICASONE PROPIONATE 50 MCG/ACT NA SUSP
1.0000 | Freq: Every day | NASAL | Status: DC
  Administered 2016-02-10: 1 via NASAL
  Filled 2016-02-10: qty 16

## 2016-02-10 MED ORDER — OXYTOCIN 40 UNITS IN LACTATED RINGERS INFUSION - SIMPLE MED: 2.5000 [IU]/h | INTRAVENOUS | Status: DC

## 2016-02-10 MED ORDER — COCONUT OIL OIL: 1.0000 "application " | TOPICAL_OIL | Status: DC | PRN

## 2016-02-10 MED ORDER — ONDANSETRON HCL 4 MG PO TABS: 4.0000 mg | ORAL_TABLET | ORAL | Status: DC | PRN

## 2016-02-10 MED ORDER — LACTATED RINGERS IV SOLN
INTRAVENOUS | Status: DC
  Administered 2016-02-10 (×2): via INTRAVENOUS

## 2016-02-10 MED ORDER — OXYTOCIN BOLUS FROM INFUSION: 500.0000 mL | INTRAVENOUS | Status: DC

## 2016-02-10 MED ORDER — TETANUS-DIPHTH-ACELL PERTUSSIS 5-2.5-18.5 LF-MCG/0.5 IM SUSP: 0.5000 mL | Freq: Once | INTRAMUSCULAR | Status: DC

## 2016-02-10 MED ORDER — LACTATED RINGERS IV SOLN: 500.0000 mL | Freq: Once | INTRAVENOUS | Status: DC

## 2016-02-10 MED ORDER — ALPRAZOLAM 0.5 MG PO TABS
1.0000 mg | ORAL_TABLET | Freq: Two times a day (BID) | ORAL | Status: DC | PRN
Start: 2016-02-10 — End: 2016-02-12

## 2016-02-10 MED ORDER — ONDANSETRON HCL 4 MG/2ML IJ SOLN: 4.0000 mg | Freq: Four times a day (QID) | INTRAMUSCULAR | Status: DC | PRN

## 2016-02-10 MED ORDER — ONDANSETRON HCL 4 MG/2ML IJ SOLN: 4.0000 mg | INTRAMUSCULAR | Status: DC | PRN

## 2016-02-10 MED ORDER — ACETAMINOPHEN 325 MG PO TABS
650.0000 mg | ORAL_TABLET | ORAL | Status: DC | PRN
  Administered 2016-02-11 – 2016-02-12 (×4): 650 mg via ORAL
  Filled 2016-02-10 (×3): qty 2

## 2016-02-10 MED ORDER — DOCUSATE SODIUM 100 MG PO CAPS
100.0000 mg | ORAL_CAPSULE | Freq: Two times a day (BID) | ORAL | Status: DC
  Administered 2016-02-10 – 2016-02-12 (×4): 100 mg via ORAL
  Filled 2016-02-10 (×4): qty 1

## 2016-02-10 MED ORDER — EPHEDRINE 5 MG/ML INJ
10.0000 mg | INTRAVENOUS | Status: DC | PRN
Start: 2016-02-10 — End: 2016-02-10
  Filled 2016-02-10: qty 2

## 2016-02-10 MED ORDER — PHENYLEPHRINE 40 MCG/ML (10ML) SYRINGE FOR IV PUSH (FOR BLOOD PRESSURE SUPPORT)
80.0000 ug | PREFILLED_SYRINGE | INTRAVENOUS | Status: DC | PRN
Start: 2016-02-10 — End: 2016-02-10
  Filled 2016-02-10: qty 5

## 2016-02-10 MED ORDER — ALBUTEROL SULFATE (2.5 MG/3ML) 0.083% IN NEBU: 3.0000 mL | INHALATION_SOLUTION | Freq: Four times a day (QID) | RESPIRATORY_TRACT | Status: DC | PRN

## 2016-02-10 MED ORDER — ZOLPIDEM TARTRATE 5 MG PO TABS: 5.0000 mg | ORAL_TABLET | Freq: Every evening | ORAL | Status: DC | PRN

## 2016-02-10 MED ORDER — MEASLES, MUMPS & RUBELLA VAC ~~LOC~~ INJ: 0.5000 mL | INJECTION | Freq: Once | SUBCUTANEOUS | Status: DC

## 2016-02-10 MED ORDER — LIDOCAINE HCL (PF) 1 % IJ SOLN
INTRAMUSCULAR | Status: DC | PRN
  Administered 2016-02-10: 8 mL via EPIDURAL
  Administered 2016-02-10: 6 mL via EPIDURAL

## 2016-02-10 MED ORDER — ASPIRIN 81 MG PO CHEW
81.0000 mg | CHEWABLE_TABLET | Freq: Every day | ORAL | Status: DC
  Administered 2016-02-11 – 2016-02-12 (×2): 81 mg via ORAL
  Filled 2016-02-10 (×3): qty 1

## 2016-02-10 MED ORDER — WITCH HAZEL-GLYCERIN EX PADS: 1.0000 "application " | MEDICATED_PAD | CUTANEOUS | Status: DC | PRN

## 2016-02-10 MED ORDER — ENOXAPARIN SODIUM 40 MG/0.4ML ~~LOC~~ SOLN
40.0000 mg | SUBCUTANEOUS | Status: DC
  Administered 2016-02-11 – 2016-02-12 (×2): 40 mg via SUBCUTANEOUS
  Filled 2016-02-10 (×3): qty 0.4

## 2016-02-10 MED ORDER — SIMETHICONE 80 MG PO CHEW: 80.0000 mg | CHEWABLE_TABLET | ORAL | Status: DC | PRN

## 2016-02-10 MED ORDER — DIPHENHYDRAMINE HCL 50 MG/ML IJ SOLN: 12.5000 mg | INTRAMUSCULAR | Status: DC | PRN

## 2016-02-10 MED ORDER — DIPHENHYDRAMINE HCL 25 MG PO CAPS
25.0000 mg | ORAL_CAPSULE | Freq: Four times a day (QID) | ORAL | Status: DC | PRN
  Administered 2016-02-11: 25 mg via ORAL
  Filled 2016-02-10: qty 1

## 2016-02-10 MED ORDER — OXYTOCIN 40 UNITS IN LACTATED RINGERS INFUSION - SIMPLE MED
1.0000 m[IU]/min | INTRAVENOUS | Status: DC
  Administered 2016-02-10: 2 m[IU]/min via INTRAVENOUS
  Filled 2016-02-10: qty 1000

## 2016-02-10 MED ORDER — EPHEDRINE 5 MG/ML INJ
10.0000 mg | INTRAVENOUS | Status: DC | PRN
  Filled 2016-02-10: qty 2

## 2016-02-10 MED ORDER — BUPRENORPHINE HCL 8 MG SL SUBL
8.0000 mg | SUBLINGUAL_TABLET | Freq: Three times a day (TID) | SUBLINGUAL | Status: DC
  Administered 2016-02-10 – 2016-02-12 (×6): 8 mg via SUBLINGUAL
  Filled 2016-02-10 (×7): qty 1

## 2016-02-10 MED ORDER — SOD CITRATE-CITRIC ACID 500-334 MG/5ML PO SOLN
30.0000 mL | ORAL | Status: DC | PRN
  Administered 2016-02-10: 30 mL via ORAL
  Filled 2016-02-10: qty 15

## 2016-02-10 MED ORDER — FLEET ENEMA 7-19 GM/118ML RE ENEM: 1.0000 | ENEMA | RECTAL | Status: DC | PRN

## 2016-02-10 MED ORDER — GABAPENTIN 300 MG PO CAPS
300.0000 mg | ORAL_CAPSULE | Freq: Four times a day (QID) | ORAL | Status: DC
  Administered 2016-02-10 – 2016-02-12 (×6): 300 mg via ORAL
  Filled 2016-02-10 (×12): qty 1

## 2016-02-10 MED ORDER — LACTATED RINGERS IV SOLN: 500.0000 mL | INTRAVENOUS | Status: DC | PRN

## 2016-02-10 MED ORDER — NICOTINE 14 MG/24HR TD PT24
14.0000 mg | MEDICATED_PATCH | Freq: Every day | TRANSDERMAL | Status: DC
  Administered 2016-02-10: 14 mg via TRANSDERMAL
  Filled 2016-02-10 (×4): qty 1

## 2016-02-10 MED ORDER — FENTANYL 2.5 MCG/ML BUPIVACAINE 1/10 % EPIDURAL INFUSION (WH - ANES)
14.0000 mL/h | INTRAMUSCULAR | Status: DC | PRN
  Administered 2016-02-10: 14 mL/h via EPIDURAL
  Filled 2016-02-10: qty 125

## 2016-02-10 MED ORDER — DIBUCAINE 1 % RE OINT: 1.0000 "application " | TOPICAL_OINTMENT | RECTAL | Status: DC | PRN

## 2016-02-10 MED ORDER — BENZOCAINE-MENTHOL 20-0.5 % EX AERO: 1.0000 "application " | INHALATION_SPRAY | CUTANEOUS | Status: DC | PRN

## 2016-02-10 MED ORDER — IBUPROFEN 600 MG PO TABS
600.0000 mg | ORAL_TABLET | Freq: Four times a day (QID) | ORAL | Status: DC
  Administered 2016-02-10 – 2016-02-12 (×7): 600 mg via ORAL
  Filled 2016-02-10 (×7): qty 1

## 2016-02-10 NOTE — H&P (Signed)
Kristy Howard is a 30 y.o. female [phone] with IUP at 6934w3d presenting for IOL for phospholipid antibody syndrome..stopped Lovenox several days ago.  [facility] since [facility].  Had several visits in [place]. Is on subutex daily for opioid dependance.  Pt states that she tested + for hepatits c several weeks ago w/ subutex MD.  There is not a lab in [place].  Pt states no F/U labs were done.   Prenatal History/Complications:  SAB X2, TAB X1; term SVD Insufficient care Subutex therapy for opioid dependence  Hep C antibody +, Viral load pending   Past Medical History: Past Medical History  Diagnosis Date  . Fibromyalgia   . Arthritis   . Asthma   . Neck pain   . Back pain   . Anxiety   . Thyroid disease   . Depression   . Attention deficit disorder (ADD)   . Hypothyroidism   . Anti-phospholipid antibody syndrome (HCC)   . Chronic bronchitis (HCC)   . Pregnancy complicated by subutex maintenance, antepartum [facility](HCC)     Past Surgical History: Past Surgical History  Procedure Laterality Date  . Wisdom tooth extraction      Obstetrical History: OB History    Gravida Para Term Preterm AB TAB SAB Ectopic Multiple Living   5 1 1  3 1 2   1       Social History: Social History   Social History  . Marital Status: Divorced    Spouse Name: N/A  . Number of Children: N/A  . Years of Education: N/A   Social History Main Topics  . Smoking status: Current Every Day Smoker -- 0.50 packs/day    Types: Cigarettes  . Smokeless tobacco: None  . Alcohol Use: No  . Drug Use: Yes    Special: Hydrocodone, Benzodiazepines     Comment: Has been on Xanax for 6 years. currently taking subutex  . Sexual Activity: Yes    Birth Control/ Protection: None   Other Topics Concern  . None   Social History Narrative    Family History: Family History  Problem Relation Age of Onset  . Hyperlipidemia Mother   . Hypertension Mother     Allergies: Allergies  Allergen  Reactions  . Morphine And Related Hives  . Vicodin [Hydrocodone-Acetaminophen] Hives    Prescriptions prior to admission  Medication Sig Dispense Refill Last Dose  . albuterol (PROVENTIL HFA;VENTOLIN HFA) 108 (90 BASE) MCG/ACT inhaler Inhale 1-2 puffs into the lungs every 6 (six) hours as needed for wheezing. 1 Inhaler 0 Taking  . ALPRAZolam (XANAX) 1 MG tablet Take 1 mg by mouth 2 (two) times daily as needed for anxiety. Reported on 01/05/2016   Not Taking  . ARIPiprazole (ABILIFY) 5 MG tablet Take 5 mg by mouth daily. Reported on 01/05/2016   Not Taking  . buprenorphine (SUBUTEX) 8 MG SUBL SL tablet Place 8 mg under the tongue daily.   Taking  . enoxaparin (LOVENOX) 40 MG/0.4ML injection Inject 0.4 mLs (40 mg total) into the skin daily. 30 Syringe 1   . FLUoxetine (PROZAC) 20 MG capsule Take 20 mg by mouth daily. Reported on 12/08/2015   Taking  . gabapentin (NEURONTIN) 300 MG capsule Take 300 mg by mouth 4 (four) times daily. Reported on 01/05/2016   Not Taking  . methylphenidate (RITALIN) 20 MG tablet Take 20 mg by mouth 2 (two) times daily. Reported on 01/05/2016   Not Taking  . terconazole (TERAZOL 7) 0.4 %  vaginal cream Place 1 applicator vaginally at bedtime. 45 g 0      Prenatal Transfer Tool   Review of Systems   Constitutional: Negative for fever and chills Eyes: Negative for visual disturbances Respiratory: Negative for shortness of breath, dyspnea Cardiovascular: Negative for chest pain or palpitations  Gastrointestinal: Negative for abdominal pain, vomiting, diarrhea and constipation.   Genitourinary: Negative for dysuria and urgency Musculoskeletal: Negative for back pain, joint pain, myalgias  Neurological: Negative for dizziness and headaches    There were no vitals taken for this visit. General appearance: alert, cooperative and no distress Lungs: clear to auscultation bilaterally Heart: regular rate and rhythm Abdomen: soft, non-tender; bowel sounds normal Pelvic:  2-3/80/-3 yesterday (deferred until RN can check<30 minutes before starting pitocin per protocol) Extremities: Homans sign is negative, no sign of DVT DTR's 2+ Presentation: cephalic Fetal monitoring  Baseline: 140 bpm, Variability: Good {> 6 bpm), Accelerations: Reactive and Decelerations: Absent Uterine activity  None      Prenatal labs: ABO, Rh: O/POS/-- (04/07 0944) Antibody: NEG (04/07 0944) Rubella: immune RPR: NON REAC (04/07 0944)  HBsAg: NEGATIVE (04/07 0944)  HIV: NONREACTIVE (04/07 0944)  GBS:   pending (collected 6/8)    Results for orders placed or performed in visit on 02/09/16 (from the past 24 hour(s))  POCT urinalysis dip (device)   Collection Time: 02/09/16 11:12 AM  Result Value Ref Range   Glucose, UA NEGATIVE NEGATIVE mg/dL   Bilirubin Urine NEGATIVE NEGATIVE   Ketones, ur NEGATIVE NEGATIVE mg/dL   Specific Gravity, Urine 1.020 1.005 - 1.030   Hgb urine dipstick TRACE (A) NEGATIVE   pH 7.0 5.0 - 8.0   Protein, ur NEGATIVE NEGATIVE mg/dL   Urobilinogen, UA 1.0 0.0 - 1.0 mg/dL   Nitrite NEGATIVE NEGATIVE   Leukocytes, UA SMALL (A) NEGATIVE   Clinic  High Risk -  Transfer from [place] Prenatal Labs  Dating  U/S Blood type: O/Positive/-- (12/12 0000)   Genetic Screen 1 Screen:    AFP:     Quad:     NIPS: Antibody:Negative (12/12 0000)  Anatomic [place]  Nml  wks (anatomy) Rubella:   Nonimmune  GTT Early:               Third trimester: 92 RPR:   NR  Flu vaccine  Declined HBsAg:   Neg  TDaP vaccine  NEEDS                                        HIV:   NR  Baby Food  Breast                                             GBS: (For PCN allergy, check sensitivities)  Contraception  Considering BTL, Medicaid  Pap:  Circumcision  n/a; female  NEED RECORDS FROM PRENTATAL CARE PROVIDER (1ST TRIMESTER); [facility]  Pediatrician  Given list   Support Person  Gilmore Laroche (FOB)     Assessment: Kristy Howard is a 30 y.o. [phone] with an IUP at [redacted]w[redacted]d presenting  for IOL for phosphlipid antibody syndrome.   Hep C antibody +  Plan: #Labor: Pitocin #Pain:  Per request:  No narcotics d/t opioid dependance #FWB Cat 1 #ID: GBS: pending; HCV RNA/CMP drawn  CRESENZO-DISHMAN,Carra Brindley 02/10/2016, 7:48 AM

## 2016-02-10 NOTE — Anesthesia Preprocedure Evaluation (Signed)
Anesthesia Evaluation  Patient identified by MRN, date of birth, ID band Patient awake    Reviewed: Allergy & Precautions, H&P , NPO status , Patient's Chart, lab work & pertinent test results  Airway Mallampati: I  TM Distance: >3 FB Neck ROM: full    Dental no notable dental hx.    Pulmonary Current Smoker,    Pulmonary exam normal        Cardiovascular negative cardio ROS Normal cardiovascular exam     Neuro/Psych    GI/Hepatic negative GI ROS,   Endo/Other    Renal/GU negative Renal ROS     Musculoskeletal   Abdominal Normal abdominal exam  (+)   Peds  Hematology negative hematology ROS (+)   Anesthesia Other Findings   Reproductive/Obstetrics (+) Pregnancy                             Anesthesia Physical Anesthesia Plan  ASA: II  Anesthesia Plan: Epidural   Post-op Pain Management:    Induction:   Airway Management Planned:   Additional Equipment:   Intra-op Plan:   Post-operative Plan:   Informed Consent: I have reviewed the patients History and Physical, chart, labs and discussed the procedure including the risks, benefits and alternatives for the proposed anesthesia with the patient or authorized representative who has indicated his/her understanding and acceptance.     Plan Discussed with:   Anesthesia Plan Comments:         Anesthesia Quick Evaluation

## 2016-02-10 NOTE — Anesthesia Pain Management Evaluation Note (Signed)
  CRNA Pain Management Visit Note  Patient: Kristy Howard, 30 y.o., female  "Hello I am a member of the anesthesia team at [facility]. We have an anesthesia team available at all times to provide care throughout the hospital, including epidural management and anesthesia for C-section. I don't know your plan for the delivery whether it a natural birth, water birth, IV sedation, nitrous supplementation, doula or epidural, but we want to meet your pain goals."   1.Was your pain managed to your expectations on prior hospitalizations?   Yes   2.What is your expectation for pain management during this hospitalization?     Epidural  3.How can we help you reach that goal? epidural  Record the patient's initial score and the patient's pain goal.   Pain: 2  Pain Goal: 7 [facility] wants you to be able to say your pain was always managed very well.  Raeann Offner 02/10/2016

## 2016-02-10 NOTE — Anesthesia Procedure Notes (Signed)
Epidural Patient location during procedure: OB Start time: 02/10/2016 11:44 AM End time: 02/10/2016 11:48 AM  Staffing Anesthesiologist: Leilani AbleHATCHETT, Sanford  Preanesthetic Checklist Completed: patient identified, surgical consent, pre-op evaluation, timeout performed, IV checked, risks and benefits discussed and monitors and equipment checked  Epidural Patient position: sitting Prep: site prepped and draped and DuraPrep Patient monitoring: continuous pulse ox and blood pressure Approach: midline Location: L3-L4 Injection technique: LOR air  Needle:  Needle type: Tuohy  Needle gauge: 17 G Needle length: 9 cm and 9 Needle insertion depth: 5 cm cm Catheter type: closed end flexible Catheter size: 19 Gauge Catheter at skin depth: 10 cm Test dose: negative and Other  Assessment Sensory level: T9 Events: blood not aspirated, injection not painful, no injection resistance, negative IV test and no paresthesia  Additional Notes Reason for block:procedure for pain

## 2016-02-11 DIAGNOSIS — J019 Acute sinusitis, unspecified: Secondary | ICD-10-CM

## 2016-02-11 DIAGNOSIS — B9689 Other specified bacterial agents as the cause of diseases classified elsewhere: Secondary | ICD-10-CM | POA: Diagnosis present

## 2016-02-11 LAB — CBC
HCT: 29.9 % — ABNORMAL LOW (ref 36.0–46.0)
HEMOGLOBIN: 9.5 g/dL — AB (ref 12.0–15.0)
MCH: 23 pg — ABNORMAL LOW (ref 26.0–34.0)
MCHC: 31.8 g/dL (ref 30.0–36.0)
MCV: 72.4 fL — ABNORMAL LOW (ref 78.0–100.0)
PLATELETS: 259 10*3/uL (ref 150–400)
RBC: 4.13 MIL/uL (ref 3.87–5.11)
RDW: 18.1 % — ABNORMAL HIGH (ref 11.5–15.5)
WBC: 11.5 10*3/uL — AB (ref 4.0–10.5)

## 2016-02-11 LAB — CULTURE, BETA STREP (GROUP B ONLY)

## 2016-02-11 MED ORDER — AMOXICILLIN-POT CLAVULANATE 875-125 MG PO TABS
1.0000 | ORAL_TABLET | Freq: Two times a day (BID) | ORAL | Status: DC
  Administered 2016-02-11 – 2016-02-12 (×3): 1 via ORAL
  Filled 2016-02-11 (×5): qty 1

## 2016-02-11 NOTE — Progress Notes (Signed)
Post Partum Day 1 Subjective:  Kristy Howard is a 30 y.o. [id number] 8257w3d s/p vaginal delivery.  No acute events overnight.  Pt denies problems with ambulating, voiding or po intake.  She denies nausea or vomiting.  Pain is well controlled.  She has had flatus.  Lochia Minimal.  Plan for birth control is IUD.  Method of Feeding:  bottle.  Patient reports cough with green sputum production and nasal congestion for the past 3 weeks. She mentions completing a course of Amoxicillin but symptoms did not improve.   Objective: Blood pressure 120/72, pulse 68, temperature 98 F (36.7 C), temperature source Oral, resp. rate 18, height 5\' 6"  (1.676 m), weight 77.111 kg (170 lb), SpO2 100 %, unknown if currently breastfeeding.  Physical Exam:  General: alert, cooperative and no distress Chest: normal WOB Heart: Regular rate Abdomen: +BS, soft, mild TTP (appropriate) DVT Evaluation: no evidence of DVT seen on physical exam. Extremities: no edema   Recent Labs  02/10/16 0815 02/11/16 0613  HGB 9.8* 9.5*  HCT 31.2* 29.9*    Assessment/Plan:  ASSESSMENT: Kristy Howard is a 30 y.o. [id number] 3957w3d s/p vaginal delivery.  #Sinus infection: Nasal congestion for the past 3 weeks. Cough with green sputum. Afebrile and lungs clear. -Augmentin 875-125mg  PO q12h  #Antiphospholipid syndrome:  -Lovenox/ASA 6 weeks postpartum x 6 weeks  #Postpartum Care Plan for discharge tomorrow Continue routine PP care Breastfeeding support PRN  CNM attestation Post Partum Day #1  Kristy Howard is a 30 y.o. [id number] s/p SVD.  Pt denies problems with ambulating, voiding or po intake. Pain is well controlled.  Plan for birth control is IUD.  Method of Feeding: bottle  PE:  BP 120/72 mmHg  Pulse 68  Temp(Src) 98 F (36.7 C) (Oral)  Resp 18  Ht 5\' 6"  (1.676 m)  Wt 77.111 kg (170 lb)  BMI 27.45 kg/m2  SpO2 100%  Breastfeeding? Unknown Fundus firm  Plan for discharge: 02/12/16, with infant  to stay for NAS Begin Augmentin 875 bid for sinus inf Today to start ASA and Lovenox  Cam HaiSHAW, KIMBERLY, CNM 9:04 AM  02/11/2016

## 2016-02-11 NOTE — Anesthesia Postprocedure Evaluation (Signed)
Anesthesia Post Note  Patient: Kristy Howard  Procedure(s) Performed: * No procedures listed *  Patient location during evaluation: Mother Baby Anesthesia Type: Epidural Level of consciousness: awake, awake and alert, oriented and patient cooperative Pain management: pain level controlled Vital Signs Assessment: post-procedure vital signs reviewed and stable Respiratory status: spontaneous breathing, nonlabored ventilation and respiratory function stable Cardiovascular status: stable Postop Assessment: no headache, no backache, patient able to bend at knees and no signs of nausea or vomiting Anesthetic complications: no     Last Vitals:  Filed Vitals:   02/10/16 2227 02/11/16 0551  BP: 119/80 120/72  Pulse: 75 68  Temp: 36.6 C 36.7 C  Resp: 16 18    Last Pain:  Filed Vitals:   02/11/16 0551  PainSc: 5    Pain Goal:                 Zavion Sleight L
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
## 2016-02-11 NOTE — Discharge Summary (Signed)
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 yo [id number] 331w3das admitted to the hospital 02/10/2016 for induction of labor.  Indication for induction: Antiphospholipid syndrome.  Patient had an uncomplicated labor course as follows: Membrane Rupture Time/Date: 11:30 AM ,02/10/2016   Intrapartum Procedures: Episiotomy: None [1]                                         Lacerations:  None [1]  Patient had delivery of a Viable infant.  Information for the patient's newborn:  FrPosey, Jasmin0[[id number]]Delivery Method: Vaginal, Spontaneous Delivery (Filed from Delivery Summary)    02/10/2016  Details of delivery can be found in separate delivery note.  Patient had a routine postpartum course. Patient is discharged home 02/12/2016.  Hepatitis C: recent diagnosis at her Subutex clinic per patient. Viral load pending at time of discharge  Antiphospholipid Antibody Syndrome: patient supposedly on Lovenox for this during pregnancy but reported not taking. Discharged on Lovenox/ASA for 6 weeks postpartum.  Acute Bacterial Sinusitis: patient with nasal congestion for 3 weeks, cough with green sputum. Started on Augmentin  [id number] q12h on 6/10. She was discharged on Augmentin to complete 5 days course.  Rubella Non-immune status: received MMR prior to discharge.  Substance use (Cocaine and THC): SW evaluated the patient cleared her for discharge. However, CSW department will review cord blood screen once it is processed and contact CPS if necessary.   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  Physical exam  Filed Vitals:   02/10/16 2227 02/11/16 0551 02/11/16 1854 02/12/16 0615  BP: 119/80 120/72 121/80 113/72  Pulse: 75 68 75 68  Temp: 97.9 F (36.6 C) 98 F (36.7 C) 97.9 F (36.6 C) 97.6 F (36.4 C)  TempSrc: Oral Oral Oral Oral  Resp: _0 Height:      Weight:      SpO2:       General: alert, cooperative and no distress Lochia: appropriate Uterine Fundus: firm Incision: N/A DVT Evaluation: No evidence of DVT seen on physical exam. Labs: Lab Results  Component Value Date   WBC 11.5* 02/11/2016   HGB 9.5* 02/11/2016  HCT 29.9* 02/11/2016   MCV 72.4* 02/11/2016   PLT 259 02/11/2016   CMP Latest Ref Rng 02/10/2016  Glucose 65 - 99 mg/dL 103(H)  BUN 6 - 20 mg/dL 8  Creatinine 0.44 - 1.00 mg/dL 0.52  Sodium 135 - 145 mmol/L 134(L)  Potassium 3.5 - 5.1 mmol/L 4.1  Chloride 101 - 111 mmol/L 104  CO2 22 - 32 mmol/L 24  Calcium 8.9 - 10.3 mg/dL 9.1  Total Protein 6.5 - 8.1 g/dL 6.8  Total Bilirubin 0.3 - 1.2 mg/dL 0.5  Alkaline Phos 38 - 126 U/L 165(H)  AST 15 - 41 U/L 31  ALT 14 - 54 U/L 36    Discharge instruction: per After Visit Summary and "Baby and Me Booklet".  After visit meds:    Medication List    STOP taking these medications        ALPRAZolam 1 MG tablet  Commonly known as:  XANAX      TAKE these medications        albuterol 108 (90 Base) MCG/ACT inhaler  Commonly known as:  PROVENTIL HFA;VENTOLIN HFA  Inhale 1-2 puffs into the lungs every 6 (six) hours as needed for wheezing.     amoxicillin-clavulanate 875-125 MG tablet  Commonly known  as:  AUGMENTIN  Take 1 tablet by mouth every 12 (twelve) hours.     aspirin 81 MG chewable tablet  Chew 1 tablet (81 mg total) by mouth daily.     buprenorphine 8 MG Subl SL tablet  Commonly known as:  SUBUTEX  Place 8 mg under the tongue 3 (three) times daily.     cyclobenzaprine 10 MG tablet  Commonly known as:  FLEXERIL  Take 1 tablet (10 mg total) by mouth 3 (three) times daily as needed for muscle spasms.     enoxaparin 40 MG/0.4ML injection  Commonly known as:  LOVENOX  Inject 0.4 mLs (40 mg total) into the skin daily.     fluticasone 50 MCG/ACT nasal spray  Commonly known as:  FLONASE  Place 1 spray into both nostrils daily.     gabapentin 300 MG capsule  Commonly known as:  NEURONTIN  Take 300 mg by mouth 4 (four) times daily. Reported on 01/05/2016     ibuprofen 600 MG tablet  Commonly known as:  ADVIL,MOTRIN  Take 1 tablet (600 mg total) by mouth every 6 (six) hours.     prenatal multivitamin Tabs tablet  Take 1 tablet by mouth daily at 12 noon.        Diet: routine diet  Activity: Advance as tolerated. Pelvic rest for 6 weeks.   Outpatient follow up:6 weeks Follow up Appt:No future appointments. Follow up Visit:No Follow-up on file.  Postpartum contraception: IUD/mirena  Newborn Data: Live born female  Birth Weight: 5 lb 10.3 oz (2560 g) APGAR: 8, 9  Baby Feeding: Bottle Disposition: nursery for observation for NAS   02/12/2016 Taye T Gonfa, MD   I spoke with and examined patient and agree with resident/PA/SNM's note and plan of care.  Recent Hep C dx, viral load pending- discussed w/ team, pt will need referral to GI/ID at pp visit- put this in problem list  Mom will be rooming in d/t baby needing to stay for NAS observation Dc'd on augmentin for sinus infection Kimberly R. Booker, CNM, WHNP-BC 02/12/2016 8:30 AM      

## 2016-02-12 LAB — HCV RNA QUANT
HCV Quantitative Log: 5.662 log10 IU/mL (ref >1.70)
HCV Quantitative: 459000 IU/mL (ref >50)

## 2016-02-12 MED ORDER — CYCLOBENZAPRINE HCL 10 MG PO TABS: 10.0000 mg | ORAL_TABLET | Freq: Three times a day (TID) | ORAL | Status: DC | PRN

## 2016-02-12 MED ORDER — ENOXAPARIN SODIUM 40 MG/0.4ML ~~LOC~~ SOLN: 40.0000 mg | SUBCUTANEOUS | Status: DC

## 2016-02-12 MED ORDER — AMOXICILLIN-POT CLAVULANATE 875-125 MG PO TABS: 1.0000 | ORAL_TABLET | Freq: Two times a day (BID) | ORAL | Status: DC

## 2016-02-12 MED ORDER — IBUPROFEN 600 MG PO TABS: 600.0000 mg | ORAL_TABLET | Freq: Four times a day (QID) | ORAL | Status: DC

## 2016-02-12 MED ORDER — ASPIRIN 81 MG PO CHEW: 81.0000 mg | CHEWABLE_TABLET | Freq: Every day | ORAL | Status: DC

## 2016-02-12 NOTE — Discharge Instructions (Signed)
Vaginal Delivery, Care After °Refer to this sheet in the next few weeks. These discharge instructions provide you with information on caring for yourself after delivery. Your health care provider may also give you specific instructions. Your treatment has been planned according to the most current medical practices available, but problems sometimes occur. Call your health care provider if you have any problems or questions after you go home. °HOME CARE INSTRUCTIONS °· Take over-the-counter or prescription medicines only as directed by your health care provider or pharmacist. °· Do not drink alcohol, especially if you are breastfeeding or taking medicine to relieve pain. °· Do not chew or smoke tobacco. °· Do not use illegal drugs. °· Continue to use good perineal care. Good perineal care includes: °· Wiping your perineum from front to back. °· Keeping your perineum clean. °· Do not use tampons or douche until your health care provider says it is okay. °· Shower, wash your hair, and take tub baths as directed by your health care provider. °· Wear a well-fitting bra that provides breast support. °· Eat healthy foods. °· Drink enough fluids to keep your urine clear or pale yellow. °· Eat high-fiber foods such as whole grain cereals and breads, brown rice, beans, and fresh fruits and vegetables every day. These foods may help prevent or relieve constipation. °· Follow your health care provider's recommendations regarding resumption of activities such as climbing stairs, driving, lifting, exercising, or traveling. °· Talk to your health care provider about resuming sexual activities. Resumption of sexual activities is dependent upon your risk of infection, your rate of healing, and your comfort and desire to resume sexual activity. °· Try to have someone help you with your household activities and your newborn for at least a few days after you leave the hospital. °· Rest as much as possible. Try to rest or take a nap  when your newborn is sleeping. °· Increase your activities gradually. °· Keep all of your scheduled postpartum appointments. It is very important to keep your scheduled follow-up appointments. At these appointments, your health care provider will be checking to make sure that you are healing physically and emotionally. °SEEK MEDICAL CARE IF:  °· You are passing large clots from your vagina. Save any clots to show your health care provider. °· You have a foul smelling discharge from your vagina. °· You have trouble urinating. °· You are urinating frequently. °· You have pain when you urinate. °· You have a change in your bowel movements. °· You have increasing redness, pain, or swelling near your vaginal incision (episiotomy) or vaginal tear. °· You have pus draining from your episiotomy or vaginal tear. °· Your episiotomy or vaginal tear is separating. °· You have painful, hard, or reddened breasts. °· You have a severe headache. °· You have blurred vision or see spots. °· You feel sad or depressed. °· You have thoughts of hurting yourself or your newborn. °· You have questions about your care, the care of your newborn, or medicines. °· You are dizzy or light-headed. °· You have a rash. °· You have nausea or vomiting. °· You were breastfeeding and have not had a menstrual period within 12 weeks after you stopped breastfeeding. °· You are not breastfeeding and have not had a menstrual period by the 12th week after delivery. °· You have a fever. °SEEK IMMEDIATE MEDICAL CARE IF:  °· You have persistent pain. °· You have chest pain. °· You have shortness of breath. °· You faint. °· You   have leg pain. °· You have stomach pain. °· Your vaginal bleeding saturates two or more sanitary pads in 1 hour. °  °This information is not intended to replace advice given to you by your health care provider. Make sure you discuss any questions you have with your health care provider. °  °Document Released: 08/17/2000 Document Revised:  05/11/2015 Document Reviewed: 04/16/2012 °Elsevier Interactive Patient Education ©2016 [employer] ° °Breastfeeding °Deciding to breastfeed is one of the best choices you can make for you and your baby. A change in hormones during pregnancy causes your breast tissue to grow and increases the number and size of your milk ducts. These hormones also allow proteins, sugars, and fats from your blood supply to make breast milk in your milk-producing glands. Hormones prevent breast milk from being released before your baby is born as well as prompt milk flow after birth. Once breastfeeding has begun, thoughts of your baby, as well as his or her sucking or crying, can stimulate the release of milk from your milk-producing glands.  °BENEFITS OF BREASTFEEDING °For Your Baby °· Your first milk (colostrum) helps your baby's digestive system function better. °· There are antibodies in your milk that help your baby fight off infections. °· Your baby has a lower incidence of asthma, allergies, and sudden infant death syndrome. °· The nutrients in breast milk are better for your baby than infant formulas and are designed uniquely for your baby's needs. °· Breast milk improves your baby's brain development. °· Your baby is less likely to develop other conditions, such as childhood obesity, asthma, or type 2 diabetes mellitus. °For You °· Breastfeeding helps to create a very special bond between you and your baby. °· Breastfeeding is convenient. Breast milk is always available at the correct temperature and costs nothing. °· Breastfeeding helps to burn calories and helps you lose the weight gained during pregnancy. °· Breastfeeding makes your uterus contract to its prepregnancy size faster and slows bleeding (lochia) after you give birth.   °· Breastfeeding helps to lower your risk of developing type 2 diabetes mellitus, osteoporosis, and breast or ovarian cancer later in life. °SIGNS THAT YOUR BABY IS HUNGRY °Early Signs of  Hunger °· Increased alertness or activity. °· Stretching. °· Movement of the head from side to side. °· Movement of the head and opening of the mouth when the corner of the mouth or cheek is stroked (rooting). °· Increased sucking sounds, smacking lips, cooing, sighing, or squeaking. °· Hand-to-mouth movements. °· Increased sucking of fingers or hands. °Late Signs of Hunger °· Fussing. °· Intermittent crying. °Extreme Signs of Hunger °Signs of extreme hunger will require calming and consoling before your baby will be able to breastfeed successfully. Do not wait for the following signs of extreme hunger to occur before you initiate breastfeeding: °· Restlessness. °· A loud, strong cry. °· Screaming. °BREASTFEEDING BASICS °Breastfeeding Initiation °· Find a comfortable place to sit or lie down, with your neck and back well supported. °· Place a pillow or rolled up blanket under your baby to bring him or her to the level of your breast (if you are seated). Nursing pillows are specially designed to help support your arms and your baby while you breastfeed. °· Make sure that your baby's abdomen is facing your abdomen. °· Gently massage your breast. With your fingertips, massage from your chest wall toward your nipple in a circular motion. This encourages milk flow. You may need to continue this action during the feeding if your   milk flows slowly. °· Support your breast with 4 fingers underneath and your thumb above your nipple. Make sure your fingers are well away from your nipple and your baby's mouth. °· Stroke your baby's lips gently with your finger or nipple. °· When your baby's mouth is open wide enough, quickly bring your baby to your breast, placing your entire nipple and as much of the colored area around your nipple (areola) as possible into your baby's mouth. °¨ More areola should be visible above your baby's upper lip than below the lower lip. °¨ Your baby's tongue should be between his or her lower gum and  your breast. °· Ensure that your baby's mouth is correctly positioned around your nipple (latched). Your baby's lips should create a seal on your breast and be turned out (everted). °· It is common for your baby to suck about 2-3 minutes in order to start the flow of breast milk. °Latching °Teaching your baby how to latch on to your breast properly is very important. An improper latch can cause nipple pain and decreased milk supply for you and poor weight gain in your baby. Also, if your baby is not latched onto your nipple properly, he or she may swallow some air during feeding. This can make your baby fussy. Burping your baby when you switch breasts during the feeding can help to get rid of the air. However, teaching your baby to latch on properly is still the best way to prevent fussiness from swallowing air while breastfeeding. °Signs that your baby has successfully latched on to your nipple: °· Silent tugging or silent sucking, without causing you pain. °· Swallowing heard between every 3-4 sucks. °· Muscle movement above and in front of his or her ears while sucking. °Signs that your baby has not successfully latched on to nipple: °· Sucking sounds or smacking sounds from your baby while breastfeeding. °· Nipple pain. °If you think your baby has not latched on correctly, slip your finger into the corner of your baby's mouth to break the suction and place it between your baby's gums. Attempt breastfeeding initiation again. °Signs of Successful Breastfeeding °Signs from your baby: °· A gradual decrease in the number of sucks or complete cessation of sucking. °· Falling asleep. °· Relaxation of his or her body. °· Retention of a small amount of milk in his or her mouth. °· Letting go of your breast by himself or herself. °Signs from you: °· Breasts that have increased in firmness, weight, and size 1-3 hours after feeding. °· Breasts that are softer immediately after breastfeeding. °· Increased milk volume, as  well as a change in milk consistency and color by the fifth day of breastfeeding. °· Nipples that are not sore, cracked, or bleeding. °Signs That Your Baby is Getting Enough Milk °· Wetting at least 3 diapers in a 24-hour period. The urine should be clear and pale yellow by age 5 days. °· At least 3 stools in a 24-hour period by age 5 days. The stool should be soft and yellow. °· At least 3 stools in a 24-hour period by age 7 days. The stool should be seedy and yellow. °· No loss of weight greater than 10% of birth weight during the first 3 days of age. °· Average weight gain of 4-7 ounces (113-198 g) per week after age 4 days. °· Consistent daily weight gain by age 5 days, without weight loss after the age of 2 weeks. °After a feeding, your baby may spit   up a small amount. This is common. °BREASTFEEDING FREQUENCY AND DURATION °Frequent feeding will help you make more milk and can prevent sore nipples and breast engorgement. Breastfeed when you feel the need to reduce the fullness of your breasts or when your baby shows signs of hunger. This is called "breastfeeding on demand." Avoid introducing a pacifier to your baby while you are working to establish breastfeeding (the first 4-6 weeks after your baby is born). After this time you may choose to use a pacifier. Research has shown that pacifier use during the first year of a baby's life decreases the risk of sudden infant death syndrome (SIDS). °Allow your baby to feed on each breast as long as he or she wants. Breastfeed until your baby is finished feeding. When your baby unlatches or falls asleep while feeding from the first breast, offer the second breast. Because newborns are often sleepy in the first few weeks of life, you may need to awaken your baby to get him or her to feed. °Breastfeeding times will vary from baby to baby. However, the following rules can serve as a guide to help you ensure that your baby is properly fed: °· Newborns (babies 4 weeks of age  or younger) may breastfeed every 1-3 hours. °· Newborns should not go longer than 3 hours during the day or 5 hours during the night without breastfeeding. °· You should breastfeed your baby a minimum of 8 times in a 24-hour period until you begin to introduce solid foods to your baby at around 6 months of age. °BREAST MILK PUMPING °Pumping and storing breast milk allows you to ensure that your baby is exclusively fed your breast milk, even at times when you are unable to breastfeed. This is especially important if you are going back to work while you are still breastfeeding or when you are not able to be present during feedings. Your lactation consultant can give you guidelines on how long it is safe to store breast milk. °A breast pump is a machine that allows you to pump milk from your breast into a sterile bottle. The pumped breast milk can then be stored in a refrigerator or freezer. Some breast pumps are operated by hand, while others use electricity. Ask your lactation consultant which type will work best for you. Breast pumps can be purchased, but some hospitals and breastfeeding support groups lease breast pumps on a monthly basis. A lactation consultant can teach you how to hand express breast milk, if you prefer not to use a pump. °CARING FOR YOUR BREASTS WHILE YOU BREASTFEED °Nipples can become dry, cracked, and sore while breastfeeding. The following recommendations can help keep your breasts moisturized and healthy: °· Avoid using soap on your nipples. °· Wear a supportive bra. Although not required, special nursing bras and tank tops are designed to allow access to your breasts for breastfeeding without taking off your entire bra or top. Avoid wearing underwire-style bras or extremely tight bras. °· Air dry your nipples for 3-4 minutes after each feeding. °· Use only cotton bra pads to absorb leaked breast milk. Leaking of breast milk between feedings is normal. °· Use lanolin on your nipples after  breastfeeding. Lanolin helps to maintain your skin's normal moisture barrier. If you use pure lanolin, you do not need to wash it off before feeding your baby again. Pure lanolin is not toxic to your baby. You may also hand express a few drops of breast milk and gently massage that milk into your   nipples and allow the milk to air dry. °In the first few weeks after giving birth, some women experience extremely full breasts (engorgement). Engorgement can make your breasts feel heavy, warm, and tender to the touch. Engorgement peaks within 3-5 days after you give birth. The following recommendations can help ease engorgement: °· Completely empty your breasts while breastfeeding or pumping. You may want to start by applying warm, moist heat (in the shower or with warm water-soaked hand towels) just before feeding or pumping. This increases circulation and helps the milk flow. If your baby does not completely empty your breasts while breastfeeding, pump any extra milk after he or she is finished. °· Wear a snug bra (nursing or regular) or tank top for 1-2 days to signal your body to slightly decrease milk production. °· Apply ice packs to your breasts, unless this is too uncomfortable for you. °· Make sure that your baby is latched on and positioned properly while breastfeeding. °If engorgement persists after 48 hours of following these recommendations, contact your health care provider or a lactation consultant. °OVERALL HEALTH CARE RECOMMENDATIONS WHILE BREASTFEEDING °· Eat healthy foods. Alternate between meals and snacks, eating 3 of each per day. Because what you eat affects your breast milk, some of the foods may make your baby more irritable than usual. Avoid eating these foods if you are sure that they are negatively affecting your baby. °· Drink milk, fruit juice, and water to satisfy your thirst (about 10 glasses a day). °· Rest often, relax, and continue to take your prenatal vitamins to prevent fatigue,  stress, and anemia. °· Continue breast self-awareness checks. °· Avoid chewing and smoking tobacco. Chemicals from cigarettes that pass into breast milk and exposure to secondhand smoke may harm your baby. °· Avoid alcohol and drug use, including marijuana. °Some medicines that may be harmful to your baby can pass through breast milk. It is important to ask your health care provider before taking any medicine, including all over-the-counter and prescription medicine as well as vitamin and herbal supplements. °It is possible to become pregnant while breastfeeding. If birth control is desired, ask your health care provider about options that will be safe for your baby. °SEEK MEDICAL CARE IF: °· You feel like you want to stop breastfeeding or have become frustrated with breastfeeding. °· You have painful breasts or nipples. °· Your nipples are cracked or bleeding. °· Your breasts are red, tender, or warm. °· You have a swollen area on either breast. °· You have a fever or chills. °· You have nausea or vomiting. °· You have drainage other than breast milk from your nipples. °· Your breasts do not become full before feedings by the fifth day after you give birth. °· You feel sad and depressed. °· Your baby is too sleepy to eat well. °· Your baby is having trouble sleeping.   °· Your baby is wetting less than 3 diapers in a 24-hour period. °· Your baby has less than 3 stools in a 24-hour period. °· Your baby's skin or the white part of his or her eyes becomes yellow.   °· Your baby is not gaining weight by 5 days of age. °SEEK IMMEDIATE MEDICAL CARE IF: °· Your baby is overly tired (lethargic) and does not want to wake up and feed. °· Your baby develops an unexplained fever. °  °This information is not intended to replace advice given to you by your health care provider. Make sure you discuss any questions you have with your   health care provider. °  °Document Released: 08/20/2005 Document Revised: 05/11/2015 Document  Reviewed: 02/11/2013 °Elsevier Interactive Patient Education ©2016 [employer] ° ° ° °

## 2016-02-12 NOTE — Progress Notes (Signed)
Patient was experiencing back pain and was requesting to leave as soon as possible to get her Flexeril prescription filled at the pharmacy it was faxed to.  RN not able to give patient the MMR or Tdap prior to patient leaving.

## 2016-02-13 ENCOUNTER — Ambulatory Visit: Payer: Self-pay

## 2016-02-13 NOTE — Lactation Note (Signed)
This note was copied from a baby's chart. Lactation Consultation Note Mother is engorged and had questions regarding comfort for the lactation consultant. Recommended lying flat on her back and gentle massaging toward the axilla and chest wall to help the lymph drain from the breast. Also ice to decrease inflammation. Support given.  Patient Name: Kristy Nejla Pew Burman FosterZDGUY'QToday's Date: 02/13/2016 Reason for consult: Other (Comment) (engorgement)   Maternal Data    Feeding    LATCH Score/Interventions                      Lactation Tools Discussed/Used     Consult Status      Soyla DryerJoseph, Lamoyne Palencia 02/13/2016, 11:09 AM

## 2016-02-14 LAB — CP5000051 PDM PROFILE
Amphetamines: NEGATIVE ng/mL (ref <500)
BENZODIAZEPINES: NEGATIVE ng/mL (ref <100)
BUPRENORPHINE: 225.6 ng/mL — AB (ref <2)
Barbiturates: NEGATIVE ng/mL (ref <300)
Buprenorphine: POSITIVE ng/mL — AB (ref <5)
COCAINE METABOLITE: NEGATIVE ng/mL (ref <150)
Desmethyltramadol: NEGATIVE ng/mL (ref <100)
FENTANYL: NEGATIVE ng/mL (ref <0.5)
MARIJUANA METABOLITE: 27 ng/mL — AB (ref <5)
MARIJUANA METABOLITE: POSITIVE ng/mL — AB (ref <20)
MDMA: NEGATIVE ng/mL (ref <500)
METHADONE METABOLITE: NEGATIVE ng/mL (ref <100)
Meperidine: NEGATIVE ng/mL (ref <100)
Meprobamate: NEGATIVE ng/mL (ref <1000)
NORBUPRENORPHINE: 914.3 ng/mL — AB (ref <2)
Naloxone: NEGATIVE ng/mL (ref <2)
Norfentanyl: NEGATIVE ng/mL (ref <0.5)
Normeperidine: NEGATIVE ng/mL (ref <100)
Nortapentadol: NEGATIVE ng/mL (ref <50)
Opiates: NEGATIVE ng/mL (ref <100)
Oxycodone: NEGATIVE ng/mL (ref <100)
PHENCYCLIDINE: NEGATIVE ng/mL (ref <25)
PROPOXYPHENE: NEGATIVE ng/mL (ref <300)
TAPENTADOL: NEGATIVE ng/mL (ref <50)
TRAMADOL: NEGATIVE ng/mL (ref <100)
ZOLPIDEM METABOLITE: NEGATIVE ng/mL (ref <5)
ZOLPIDEM: NEGATIVE ng/mL (ref <5)

## 2016-02-14 NOTE — Progress Notes (Signed)
CSW staffed [facility] consult with 2 CSW staff (Colleen Shaw and Hannah Coble). At this time there is no evidence of a need to make a report to CPS. If cord screen is positive, CSW will make a report to CPS.  Child will receive follow-up care with [facility].  If concerns are encountered, staff should make a report to CPS. 

## 2016-02-15 ENCOUNTER — Encounter: Payer: Self-pay | Admitting: *Deleted

## 2016-02-15 ENCOUNTER — Telehealth: Payer: Self-pay | Admitting: *Deleted

## 2016-02-15 NOTE — Telephone Encounter (Signed)
Pt left message stating that she had her baby on 6/9. She is requesting the quant test results so she "will know how to proceed with things". Per chart review, pt had HCV RNA Quant and does not appear that results have been reviewed by a provider. I returned pt's call and she stated that she is still @ the hospital but has been discharged. She also stated that Dr. Ashok PallWouk is currently at the nurse's station. I recommended that pt ask him how she needs to proceed with additional care if any, in regards to the HCV RNA test result. Pt agreed and voiced understanding.

## 2016-05-01 ENCOUNTER — Ambulatory Visit: Payer: Medicaid Other | Admitting: Advanced Practice Midwife

## 2017-01-04 DIAGNOSIS — O322XX0 Maternal care for transverse and oblique lie, not applicable or unspecified: Secondary | ICD-10-CM

## 2017-01-04 DIAGNOSIS — O4290 Premature rupture of membranes, unspecified as to length of time between rupture and onset of labor, unspecified weeks of gestation: Secondary | ICD-10-CM

## 2019-07-18 ENCOUNTER — Other Ambulatory Visit: Payer: Self-pay

## 2019-07-18 ENCOUNTER — Emergency Department (HOSPITAL_COMMUNITY)
Admission: EM | Admit: 2019-07-18 | Discharge: 2019-07-19 | Disposition: A | Discharge status: Home / Self Care | Payer: Self-pay | Attending: Emergency Medicine | Admitting: Emergency Medicine

## 2019-07-18 ENCOUNTER — Encounter (HOSPITAL_COMMUNITY): Payer: Self-pay | Admitting: *Deleted

## 2019-07-18 DIAGNOSIS — R590 Localized enlarged lymph nodes: Secondary | ICD-10-CM | POA: Insufficient documentation

## 2019-07-18 DIAGNOSIS — F909 Attention-deficit hyperactivity disorder, unspecified type: Secondary | ICD-10-CM | POA: Insufficient documentation

## 2019-07-18 DIAGNOSIS — J0100 Acute maxillary sinusitis, unspecified: Secondary | ICD-10-CM | POA: Insufficient documentation

## 2019-07-18 DIAGNOSIS — Z79899 Other long term (current) drug therapy: Secondary | ICD-10-CM | POA: Insufficient documentation

## 2019-07-18 DIAGNOSIS — F17210 Nicotine dependence, cigarettes, uncomplicated: Secondary | ICD-10-CM | POA: Insufficient documentation

## 2019-07-18 DIAGNOSIS — Z760 Encounter for issue of repeat prescription: Secondary | ICD-10-CM | POA: Insufficient documentation

## 2019-07-18 DIAGNOSIS — R0981 Nasal congestion: Secondary | ICD-10-CM | POA: Insufficient documentation

## 2019-07-18 DIAGNOSIS — Z862 Personal history of diseases of the blood and blood-forming organs and certain disorders involving the immune mechanism: Secondary | ICD-10-CM | POA: Insufficient documentation

## 2019-07-18 MED ORDER — WARFARIN SODIUM 10 MG PO TABS
10.0000 mg | ORAL_TABLET | Freq: Once | ORAL | Status: DC
  Filled 2019-07-18: qty 1

## 2019-07-18 MED ORDER — AMOXICILLIN-POT CLAVULANATE 875-125 MG PO TABS: 1.0000 | ORAL_TABLET | Freq: Two times a day (BID) | ORAL | 0 refills | Status: AC

## 2019-07-18 MED ORDER — WARFARIN SODIUM 10 MG PO TABS: 10.0000 mg | ORAL_TABLET | Freq: Every day | ORAL | 0 refills | Status: DC

## 2019-07-18 MED ORDER — FLUTICASONE PROPIONATE 50 MCG/ACT NA SUSP: 2.0000 | Freq: Every day | NASAL | 0 refills | Status: DC

## 2019-07-18 NOTE — ED Triage Notes (Signed)
The pt has mouth and ear pain with gum disease for 1-2 weeks  lmp irregular

## 2019-07-18 NOTE — ED Provider Notes (Signed)
[name]   CSN: [id number] Arrival date & time: 07/18/19  2149     History   Chief Complaint Chief Complaint  Patient presents with  . Dental Pain    HPI Kristy Howard is a 33 y.o. female.     HPI   Patient is 33 year old female history of antiphospholipid antibody syndrome, ADHD, asthma, chronic bronchitis, anxiety/depression, who presents to the emergency department today for evaluation of URI symptoms and medication refill.  Patient states that the past 1.5 weeks she has had rhinorrhea, nasal congestion with green nasal discharge.  She has also had pain to the right side of her face near her sinuses.  This is also causing her to have dental pain as well.  She has been taking over-the-counter medications without significant improvement of symptoms.  She denies any fevers.  She denies sore throat.  Denies cough, shortness of breath, chest pain.  Denies any unilateral leg swelling or calf pain.  No hemoptysis.  She also reports a history of antiphospholipid antibody syndrome and states that she is chronically on Coumadin but recently moved to the area and has been out of her Coumadin for several months.  She is to be on 10 mg daily except for Sunday.  She is requesting medication refill and help with follow-up.  Past Medical History:  Diagnosis Date  . Anti-phospholipid antibody syndrome (HCC)   . Anxiety   . Arthritis   . Asthma   . Attention deficit disorder (ADD)   . Back pain   . Chronic bronchitis (HCC)   . Depression   . Fibromyalgia   . Neck pain   . Pregnancy complicated by subutex maintenance, antepartum (HCC)   . Thyroid disease     Patient Active Problem List   Diagnosis Date Noted  . Acute bacterial sinusitis 02/11/2016  . Hepatitis C 02/10/2016  . Antiphospholipid antibody syndrome (HCC) 02/10/2016  . Vaginal delivery 02/10/2016  . Rubella non-immune status, antepartum 12/12/2015  . 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 Threatened premature labor, antepartum(644.03) 12/08/2015  . Supervision of high-risk pregnancy 11/10/2015  . Hypothyroidism in pregnancy 11/10/2015  . Anxiety 11/10/2015  . Phospholipid antibody syndrome affecting pregnancy (HCC) 11/10/2015    Past Surgical History:  Procedure Laterality Date  . WISDOM TOOTH EXTRACTION       OB History    Gravida  7   Para  2   Term  2   Preterm      AB  5   Living  2     SAB  4   TAB  1   Ectopic      Multiple  0   Live Births  2            Home Medications    Prior to Admission medications   Medication Sig Start Date End Date Taking? Authorizing Provider  albuterol (PROVENTIL HFA;VENTOLIN HFA) 108 (90 BASE) MCG/ACT inhaler Inhale 1-2 puffs into the lungs every 6 (six) hours as needed for wheezing. 06/07/12  Yes Reuben Likes, MD  Multiple Vitamins-Minerals (AIRBORNE) TBEF Take 1 tablet by mouth daily as needed (immune system boost).   Yes [provider]  amoxicillin-clavulanate (AUGMENTIN) 875-125 MG tablet Take 1 tablet by mouth every 12 (twelve) hours for 7 days. 07/18/19 07/25/19  Daran Favaro S, PA-C  cyclobenzaprine (FLEXERIL) 10 MG tablet Take 1 tablet (10 mg total) by mouth 3 (three) times daily  as needed for muscle spasms. Patient not taking: Reported on 07/18/2019 02/12/16   Mercy Riding, MD  fluticasone (FLONASE) 50 MCG/ACT nasal spray Place 2 sprays into both nostrils daily. 07/18/19   Islam Eichinger S, PA-C  warfarin (COUMADIN) 10 MG tablet Take 1 tablet (10 mg total) by mouth daily. Take 10mg  daily every day except for Sunday 07/18/19   Anayely Constantine S, PA-C    Family History Family History  Problem Relation Age of Onset  . Hyperlipidemia Mother   . Hypertension Mother     Social History Social History   Tobacco Use  . Smoking status: Current Every Day Smoker    Packs/day: 0.50    Types: Cigarettes  . Smokeless  tobacco: Never Used  Substance Use Topics  . Alcohol use: No  . Drug use: Yes    Types: Hydrocodone, Benzodiazepines    Comment: Has been on Xanax for 6 years. currently taking subutex     Allergies   Morphine and related and Vicodin [hydrocodone-acetaminophen]   Review of Systems Review of Systems  Constitutional: Negative for chills and fever.  HENT: Positive for congestion, rhinorrhea, sinus pressure and sinus pain. Negative for ear pain and sore throat.   Eyes: Negative for visual disturbance.  Respiratory: Negative for cough and shortness of breath.   Cardiovascular: Negative for chest pain.  Gastrointestinal: Negative for abdominal pain, constipation, diarrhea, nausea and vomiting.  Genitourinary: Negative for dysuria and hematuria.  Musculoskeletal: Negative for back pain.  Skin: Negative for color change and rash.  Neurological: Negative for seizures and syncope.  All other systems reviewed and are negative.    Physical Exam Updated Vital Signs BP 125/78 (BP Location: Left Arm)   Pulse (!) 102   Temp 97.9 F (36.6 C) (Oral)   Resp 18   Ht 5\' 8"  (1.727 Howard)   Wt 86.2 kg   SpO2 100%   BMI 28.89 kg/Howard   Physical Exam Vitals signs and nursing note reviewed.  Constitutional:      General: She is not in acute distress.    Appearance: She is well-developed.  HENT:     Head: Normocephalic and atraumatic.     Right Ear: Tympanic membrane normal.     Left Ear: Tympanic membrane normal.     Nose:     Comments: TTP to the right maxillary sinus.     Mouth/Throat:     Pharynx: No oropharyngeal exudate or posterior oropharyngeal erythema.     Comments: No obvious dental abscess Eyes:     Conjunctiva/sclera: Conjunctivae normal.  Neck:     Musculoskeletal: Neck supple.  Cardiovascular:     Rate and Rhythm: Normal rate and regular rhythm.     Pulses: Normal pulses.     Heart sounds: Normal heart sounds. No murmur.     Comments: HR 86 on monitor Pulmonary:      Effort: Pulmonary effort is normal. No respiratory distress.     Breath sounds: Normal breath sounds. No wheezing, rhonchi or rales.  Abdominal:     General: Bowel sounds are normal.     Palpations: Abdomen is soft.     Tenderness: There is no abdominal tenderness.  Lymphadenopathy:     Cervical: Cervical adenopathy present.  Skin:    General: Skin is warm and dry.  Neurological:     Mental Status: She is alert.      ED Treatments / Results  Labs (all labs ordered are listed, but only abnormal results are displayed)  Labs Reviewed - No data to display  EKG None  Radiology No results found.  Procedures Procedures (including critical care time)  Medications Ordered in ED Medications  warfarin (COUMADIN) tablet 10 mg (has no administration in time range)     Initial Impression / Assessment and Plan / ED Course  I have reviewed the triage vital signs and the nursing notes.  Pertinent labs & imaging results that were available during my care of the patient were reviewed by me and considered in my medical decision making (see chart for details).   Final Clinical Impressions(Howard) / ED Diagnoses   Final diagnoses:  Acute maxillary sinusitis, recurrence not specified  History of antiphospholipid antibody syndrome   Patient complaining of symptoms of sinusitis.    Severe symptoms have been present for greater than 10 days with purulent nasal discharge and maxillary sinus pain.  Concern for acute bacterial rhinosinusitis.  Patient discharged with Augmentin and flonse.  Instructions given for warm saline nasal wash and recommendations for follow-up with primary care physician.    Pt also requesting med refill of her coumadin. Has been out for some time. No signs sxs to suggest pe/dvt. Hr normal on my exam. Will refill coumadin at her prior dosage. Advised f/u in 3 days for check of her coumadin. Gave info for coumadin clinic for f/u. If she cannot get into the coumadin clinic  then she should return to the ED to have INR rechecked. She is also given pcp f/u. Case mgmt and social work consulted to help pt f/u.     ED Discharge Orders         Ordered    warfarin (COUMADIN) 10 MG tablet  Daily     07/18/19 2342    amoxicillin-clavulanate (AUGMENTIN) 875-125 MG tablet  Every 12 hours     07/18/19 2343    fluticasone (FLONASE) 50 MCG/ACT nasal spray  Daily     07/18/19 2343           Karrie MeresCouture, Kristy Hurlbut S, PA-C 07/18/19 2348    Sabas SousBero, Kristy M, MD 07/19/19 1456

## 2019-07-18 NOTE — Discharge Instructions (Addendum)
Please take coumadin as directed. You will need to have your INR rechecked in 3 days.  I have given you information to follow-up with the Coumadin clinic.  If you are unable to follow-up within 3 days to have your INR rechecked you will need to return to the ED to have it rechecked so that we can titrate your medications and make sure you are therapeutic.    For your other symptoms, you were given an antibiotic and Flonase.  Please take as directed.  Please return to the emergency department for any new or worsening symptoms in the meantime.

## 2019-07-19 NOTE — ED Notes (Signed)
Patient verbalizes understanding of discharge instructions. Opportunity for questioning and answers were provided. Armband removed by staff, pt discharged from ED.  

## 2019-07-21 ENCOUNTER — Telehealth: Payer: Self-pay | Admitting: Surgery

## 2019-07-21 NOTE — Telephone Encounter (Signed)
Contacted patient via phone patient states she has not picked up prescription due to cost and patient is uninsured.  Patient eligible and enrolled in [facility] program. Patient requested Match letter be sent to [facility] on [facility] letter faxed confirmation received via [facility]. Discussed follow up at the [facility] patient is agreeable provided permission to send information to [facility], Sent in-basket message to CM to follow up with patient for ED f/u visit. Patient also given clinic's contact information.  No further ED CM needs identified.

## 2019-07-21 NOTE — Telephone Encounter (Signed)
                  Dear   : Kristy Howard  You have been approved to have the prescriptions written by your discharging physician filled through our [facility] (Medication Assistance Through [facility]) program. This program allows for a one-time (no refills) 34-day supply of selected medications for a low copay amount.  The copay is $3.00 per prescription. For instance, if you have one prescription, you will pay $3.00; for two prescriptions, you pay $6.00; for three prescriptions, you pay $9.00; and so on.  Only certain pharmacies are participating in this program with [facility]. You will need to select one of the pharmacies from the attached list and take your prescriptions, this letter, and your photo ID to one of the participating pharmacies.   We are excited that you are able to use the [facility] program to get your medications. These prescriptions must be filled within 7 days of hospital discharge or they will no longer be valid for the [facility] program. Should you have any problems with your prescriptions please contact your case management team member at [phone] for [place] [facility]/ [place] you, [place] Management

## 2019-07-23 ENCOUNTER — Telehealth: Payer: Self-pay

## 2019-07-23 DIAGNOSIS — D6861 Antiphospholipid syndrome: Secondary | ICD-10-CM

## 2019-07-23 NOTE — Telephone Encounter (Signed)
I have documented an order for INR and also can you help me get her a referral to our coumadin clinic with [facility] for the warfarin managment

## 2019-07-23 NOTE — Telephone Encounter (Signed)
Message received from Laurena Slimmer, RN CM requesting a hospital follow up appointment for the patient. She is also in need of INR.   Spoke to patient and scheduled appt for 07/27/2019 @ 0900 at [facility].  She said that she just picked up her medications yesterday - 07/22/2019 and took the coumadin for the first time last night.  She has been living out of the area and has not been on coumadin for a few months

## 2019-07-26 NOTE — Progress Notes (Deleted)
   Subjective:    Patient ID: Kristy Howard, female    DOB: [birth year], 33 y.o.   MRN: [id number]  32 y.o.F here for post ED f/u and to establish  Patient is 33 year old female history of antiphospholipid antibody syndrome, ADHD, asthma, chronic bronchitis, anxiety/depression, who presents to the emergency department today for evaluation of URI symptoms and medication refill.  Patient states that the past 1.5 weeks she has had rhinorrhea, nasal congestion with green nasal discharge.  She has also had pain to the right side of her face near her sinuses.  This is also causing her to have dental pain as well.  She has been taking over-the-counter medications without significant improvement of symptoms.  She denies any fevers.  She denies sore throat.  Denies cough, shortness of breath, chest pain.  Denies any unilateral leg swelling or calf pain.  No hemoptysis.  She also reports a history of antiphospholipid antibody syndrome and states that she is chronically on Coumadin but recently moved to the area and has been out of her Coumadin for several months.  She is to be on 10 mg daily except for Sunday.  She is requesting medication refill and help with follow-up.   Severe symptoms have been present for greater than 10 days with purulent nasal discharge and maxillary sinus pain.  Concern for acute bacterial rhinosinusitis.  Patient discharged with Augmentin and flonse.  Instructions given for warm saline nasal wash and recommendations for follow-up with primary care physician.    Pt also requesting med refill of her coumadin. Has been out for some time. No signs sxs to suggest pe/dvt. Hr normal on my exam. Will refill coumadin at her prior dosage. Advised f/u in 3 days for check of her coumadin. Gave info for coumadin clinic for f/u. If she cannot get into the coumadin clinic then she should return to the ED to have INR rechecked. She is also given pcp f/u. Case mgmt and social work consulted to  help pt f/u.         Review of Systems     Objective:   Physical Exam        Assessment & Plan:

## 2019-07-27 ENCOUNTER — Ambulatory Visit: Payer: Self-pay | Admitting: Critical Care Medicine

## 2019-07-29 ENCOUNTER — Ambulatory Visit: Payer: Self-pay | Admitting: Critical Care Medicine

## 2019-07-29 ENCOUNTER — Ambulatory Visit (INDEPENDENT_AMBULATORY_CARE_PROVIDER_SITE_OTHER): Payer: Self-pay | Admitting: Primary Care

## 2020-10-21 ENCOUNTER — Other Ambulatory Visit: Payer: Self-pay | Admitting: Psychiatry

## 2020-10-21 ENCOUNTER — Other Ambulatory Visit: Payer: Self-pay | Admitting: Pharmacy Technician

## 2020-10-21 MED FILL — lamoTRIgine 25 MG TABS: 25 | 30 days supply | Qty: 60 | Fill #0

## 2020-10-26 ENCOUNTER — Other Ambulatory Visit: Payer: Self-pay | Admitting: Psychiatry

## 2020-10-26 MED FILL — HYDROXYZINE PAM 25 MG CAP: 25 | 30 days supply | Qty: 90 | Fill #0

## 2020-11-29 ENCOUNTER — Other Ambulatory Visit: Payer: Self-pay | Admitting: Nurse Practitioner

## 2020-11-29 ENCOUNTER — Other Ambulatory Visit (HOSPITAL_BASED_OUTPATIENT_CLINIC_OR_DEPARTMENT_OTHER): Payer: Self-pay

## 2020-11-29 MED ORDER — ROPINIROLE HCL 0.25 MG PO TABS
ORAL_TABLET | ORAL | 0 refills | Status: DC
  Filled 2020-11-29: qty 30, 30d supply, fill #0

## 2020-11-29 MED ORDER — ALBUTEROL SULFATE HFA 108 (90 BASE) MCG/ACT IN AERS
INHALATION_SPRAY | RESPIRATORY_TRACT | 0 refills | Status: DC
  Filled 2020-11-29: qty 18, 17d supply, fill #0

## 2020-11-29 MED ORDER — AMOXICILLIN 500 MG PO CAPS
ORAL_CAPSULE | ORAL | 0 refills | Status: DC
  Filled 2020-11-29: qty 30, 10d supply, fill #0

## 2020-11-29 MED ORDER — FLUTICASONE PROPIONATE 50 MCG/ACT NA SUSP
NASAL | 0 refills | Status: AC
  Filled 2020-11-29: qty 16, 30d supply, fill #0

## 2020-11-30 ENCOUNTER — Other Ambulatory Visit (HOSPITAL_COMMUNITY): Payer: Self-pay

## 2020-11-30 ENCOUNTER — Other Ambulatory Visit (HOSPITAL_BASED_OUTPATIENT_CLINIC_OR_DEPARTMENT_OTHER): Payer: Self-pay

## 2020-11-30 MED ORDER — CHOLECALCIFEROL 50 MCG (2000 UT) PO CAPS
1.0000 | ORAL_CAPSULE | Freq: Every day | ORAL | 2 refills | Status: DC
  Filled 2020-11-30: qty 30, 30d supply, fill #0

## 2020-12-08 ENCOUNTER — Other Ambulatory Visit: Payer: Self-pay

## 2020-12-08 MED FILL — Albuterol Sulfate Inhal Aero 108 MCG/ACT (90MCG Base Equiv): RESPIRATORY_TRACT | 25 days supply | Qty: 18 | Fill #0 | Status: CN

## 2020-12-08 MED FILL — Amoxicillin (Trihydrate) Cap 500 MG: ORAL | 10 days supply | Qty: 30 | Fill #0 | Status: AC

## 2020-12-08 MED FILL — Ropinirole Hydrochloride Tab 0.25 MG: ORAL | 30 days supply | Qty: 30 | Fill #0 | Status: AC

## 2020-12-15 ENCOUNTER — Other Ambulatory Visit: Payer: Self-pay

## 2020-12-19 ENCOUNTER — Other Ambulatory Visit: Payer: Self-pay

## 2021-02-16 ENCOUNTER — Ambulatory Visit: Payer: Self-pay | Admitting: *Deleted

## 2021-02-16 ENCOUNTER — Emergency Department (HOSPITAL_COMMUNITY)
Admission: EM | Admit: 2021-02-16 | Discharge: 2021-02-17 | Disposition: A | Discharge status: Home / Self Care | Payer: Self-pay | Attending: Emergency Medicine | Admitting: Emergency Medicine

## 2021-02-16 ENCOUNTER — Other Ambulatory Visit: Payer: Self-pay

## 2021-02-16 ENCOUNTER — Encounter (HOSPITAL_COMMUNITY): Payer: Self-pay

## 2021-02-16 DIAGNOSIS — Z7901 Long term (current) use of anticoagulants: Secondary | ICD-10-CM | POA: Insufficient documentation

## 2021-02-16 DIAGNOSIS — F17210 Nicotine dependence, cigarettes, uncomplicated: Secondary | ICD-10-CM | POA: Insufficient documentation

## 2021-02-16 DIAGNOSIS — J45909 Unspecified asthma, uncomplicated: Secondary | ICD-10-CM | POA: Insufficient documentation

## 2021-02-16 DIAGNOSIS — L03211 Cellulitis of face: Secondary | ICD-10-CM | POA: Insufficient documentation

## 2021-02-16 MED ORDER — DOXYCYCLINE HYCLATE 100 MG PO CAPS
100.0000 mg | ORAL_CAPSULE | Freq: Two times a day (BID) | ORAL | 0 refills | Status: DC
Start: 2021-02-16 — End: 2024-05-19

## 2021-02-16 MED ORDER — DOXYCYCLINE HYCLATE 100 MG PO TABS
100.0000 mg | ORAL_TABLET | Freq: Once | ORAL | Status: AC
  Administered 2021-02-16: 100 mg via ORAL
  Filled 2021-02-16: qty 1

## 2021-02-16 MED ORDER — CEPHALEXIN 500 MG PO CAPS
500.0000 mg | ORAL_CAPSULE | Freq: Once | ORAL | Status: AC
  Administered 2021-02-16: 500 mg via ORAL
  Filled 2021-02-16: qty 1

## 2021-02-16 MED ORDER — CEPHALEXIN 500 MG PO CAPS: 500.0000 mg | ORAL_CAPSULE | Freq: Four times a day (QID) | ORAL | 0 refills | Status: DC

## 2021-02-16 NOTE — Telephone Encounter (Signed)
Patient calls with facial swelling at the chin and up both jaw lines to the ears including the bottom lip. Now with bilateral ear pain and sore throat. Denies difficulty breathing/swallowing. Began over the last 5 days as a pimple-like bump on the chin, turned to multiple boils that had green-pus like drainage which she has been popping, using peroxide to clean and taking oral clindamycin 300 mg tabs that were not prescribed to her.  Advised ED at this time for evaluation.   Reason for Disposition  [1] SEVERE swelling of entire face AND [2] < 2 hours since exposure to high-risk allergen (e.g., peanuts, tree nuts, fish, shellfish or 1st dose of drug) AND [3] no serious symptoms AND [4] no serious allergic reaction in the past  Answer Assessment - Initial Assessment Questions 1. ONSET: "When did the swelling start?" (e.g., minutes, hours, days)    Over the last 5 days. Started with a bump on the chin. 2. LOCATION: "What part of the face is swollen?"    Chin area, bottom lip is swollen 3. SEVERITY: "How swollen is it?"    Swelling is going towards the ear 4. ITCHING: "Is there any itching?" If Yes, ask: "How much?"   (Scale 1-10; mild, moderate or severe)     Sometimes itching 5. PAIN: "Is the swelling painful to touch?" If Yes, ask: "How painful is it?"   (Scale 1-10; mild, moderate or severe)   - NONE (0): no pain   - MILD (1-3): doesn't interfere with normal activities    - MODERATE (4-7): interferes with normal activities or awakens from sleep    - SEVERE (8-10): excruciating pain, unable to do any normal activities      6 6. FEVER: "Do you have a fever?" If Yes, ask: "What is it, how was it measured, and when did it start?"      no 7. CAUSE: "What do you think is causing the face swelling?"     I have multiple boils on my chin 8. RECURRENT SYMPTOM: "Have you had face swelling before?" If Yes, ask: "When was the last time?" "What happened that time?"     no 9. OTHER SYMPTOMS: "Do you  have any other symptoms?" (e.g., toothache, leg swelling)     Sore throat, bilateral ear aches now 10. PREGNANCY: "Is there any chance you are pregnant?" "When was your last menstrual period?"       Did not ask  Protocols used: Face Swelling-A-AH

## 2021-02-16 NOTE — ED Provider Notes (Signed)
[facility]-EMERGENCY DEPT Provider Note   CSN: [id number] Arrival date & time: 02/16/21  2215     History Chief Complaint  Patient presents with   Abscess    Kristy Howard is a 35 y.o. female.  The history is provided by the patient.  Abscess Location:  Face Facial abscess location:  Chin Size:  1 cm Abscess quality: draining and redness   Red streaking: no   Duration:  3 days Progression since onset: now draining. Context: not insect bite/sting   Relieved by:  Nothing Worsened by:  Nothing Ineffective treatments: taking suboptimal doses of someone else's clindamycin. Associated symptoms: no fever and no vomiting   Risk factors: no family hx of MRSA       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, antepartum (HCC) 12/09/2015   Threatened premature labor, antepartum(644.03) 12/08/2015   Supervision of high-risk pregnancy 11/10/2015   Hypothyroidism in pregnancy 11/10/2015   Anxiety 11/10/2015   Phospholipid antibody syndrome affecting pregnancy (HCC) 11/10/2015    Past Surgical History:  Procedure Laterality Date   WISDOM TOOTH EXTRACTION       OB History     Gravida  7   Para  2   Term  2   Preterm      AB  5   Living  2      SAB  4   IAB  1   Ectopic      Multiple  0   Live Births  2           Family History  Problem Relation  Age of Onset   Hyperlipidemia Mother    Hypertension Mother     Social History   Tobacco Use   Smoking status: Every Day    Packs/day: 0.50    Pack years: 0.00    Types: Cigarettes   Smokeless tobacco: Never  Substance Use Topics   Alcohol use: No   Drug use: Yes    Types: Marijuana    Home Medications Prior to Admission medications   Medication Sig Start Date End Date Taking? Authorizing Provider  cephALEXin (KEFLEX) 500 MG capsule Take 1 capsule (500 mg total) by mouth 4 (four) times daily. 02/16/21  Yes Adaley Kiene, MD  doxycycline (VIBRAMYCIN) 100 MG capsule Take 1 capsule (100 mg total) by mouth 2 (two) times daily. One po bid x 7 days 02/16/21  Yes Natara Monfort, MD  albuterol (PROVENTIL HFA;VENTOLIN HFA) 108 (90 BASE) MCG/ACT inhaler Inhale 1-2 puffs into the lungs every 6 (six) hours as needed for wheezing. 06/07/12   Reuben Likes, MD  albuterol (VENTOLIN HFA) 108 (90 Base) MCG/ACT inhaler INHALE 2 PUFFS EVERY 4-6 HOURS AS NEEDED FOR WHEEZING OR SHORTNESS OF BREATH 11/29/20 11/29/21  Dartha Lodge, FNP  amoxicillin (AMOXIL) 500 MG capsule TAKE 1  CAPSULE BY MOUTH 3 TIMES DAILY 11/29/20 11/29/21  Dartha Lodge, FNP  cyclobenzaprine (FLEXERIL) 10 MG tablet Take 1 tablet (10 mg total) by mouth 3 (three) times daily as needed for muscle spasms. Patient not taking: Reported on 07/18/2019 02/12/16   Almon Hercules, MD  fluticasone (FLONASE) 50 MCG/ACT nasal spray Place 2 sprays into both nostrils daily. 07/18/19   Couture, Cortni S, PA-C  fluticasone (FLONASE) 50 MCG/ACT nasal spray Spray two sprays into both nostrils once a day for allergies 11/29/20     hydrOXYzine (VISTARIL) 25 MG capsule TAKE 1 CAPSULE BY MOUTH 3 TIMES DAILY AS NEEDED 10/26/20 10/26/21  Marcellina Millin, MD  lamoTRIgine (LAMICTAL) 25 MG tablet TAKE 1 TABLET BY MOUTH TWICE DAILY. 10/21/20 10/21/21  Marcellina Millin, MD  Multiple Vitamins-Minerals (AIRBORNE) TBEF Take 1 tablet by mouth daily as needed  (immune system boost).    [provider]  rOPINIRole (REQUIP) 0.25 MG tablet TAKE 1 TABLET BY MOUTH AT BEDTIME FOR RESTLESS LEG 11/29/20 11/29/21  Dartha Lodge, FNP  warfarin (COUMADIN) 10 MG tablet Take 1 tablet (10 mg total) by mouth daily. Take 10mg  daily every day except for Sunday 07/18/19   Couture, Cortni S, PA-C    Allergies    Morphine and related and Vicodin [hydrocodone-acetaminophen]  Review of Systems   Review of Systems  Constitutional:  Negative for fever.  HENT:  Negative for drooling.   Eyes:  Negative for redness.  Respiratory:  Negative for wheezing.   Cardiovascular:  Negative for leg swelling.  Gastrointestinal:  Negative for vomiting.  Genitourinary:  Negative for difficulty urinating.  Musculoskeletal:  Negative for neck stiffness.  Skin:  Positive for color change.  Neurological:  Negative for facial asymmetry.  Psychiatric/Behavioral:  Negative for agitation.   All other systems reviewed and are negative.  Physical Exam Updated Vital Signs BP (!) 143/111 (BP Location: Right Arm)   Pulse (!) 104   Temp 98.3 F (36.8 C) (Oral)   Resp 18   Ht 5\' 8"  (1.727 m)   Wt 90.7 kg   SpO2 100%   BMI 30.41 kg/m   Physical Exam Vitals and nursing note reviewed.  Constitutional:      Appearance: Normal appearance. She is not ill-appearing.  HENT:     Head: Normocephalic and atraumatic.      Mouth/Throat:     Mouth: Mucous membranes are moist.  Eyes:     Conjunctiva/sclera: Conjunctivae normal.     Pupils: Pupils are equal, round, and reactive to light.  Cardiovascular:     Rate and Rhythm: Normal rate and regular rhythm.     Pulses: Normal pulses.     Heart sounds: Normal heart sounds.  Pulmonary:     Effort: Pulmonary effort is normal.     Breath sounds: Normal breath sounds.  Abdominal:     General: Abdomen is flat. Bowel sounds are normal.     Palpations: Abdomen is soft.     Tenderness: There is no abdominal tenderness. There is no  guarding.  Musculoskeletal:        General: Normal range of motion.     Cervical back: Normal range of motion and neck supple.  Lymphadenopathy:     Cervical: No cervical adenopathy.  Skin:    General: Skin is warm and dry.     Capillary Refill: Capillary refill takes less than 2 seconds.     Comments: Warmth and erythema, no fluctuance of area on chin at this time  Neurological:     General: No focal deficit present.     Mental Status: She is alert and oriented to person, place, and time.     Deep Tendon Reflexes: Reflexes normal.  Psychiatric:        Mood and Affect: Mood normal.    ED Results / Procedures / Treatments   Labs (all labs ordered are listed, but only abnormal results are displayed) Labs Reviewed - No data to display  EKG None  Radiology No results found.  Procedures Procedures   Medications Ordered in ED Medications  cephALEXin (KEFLEX) capsule 500 mg (500 mg Oral Given 02/16/21 2331)  doxycycline (VIBRA-TABS) tablet 100 mg (100 mg Oral Given 02/16/21 2331)    ED Course  I have reviewed the triage vital signs and the nursing notes.  Pertinent labs & imaging results that were available during my care of the patient were reviewed by me and considered in my medical decision making (see chart for details).    Cellulitis at this time.  Nothing to drain at this time.  No signs of sepsis.  No LAN at this time.  No indication for imaging at this time.  Will change to doxycyline and keflex at therapeutic doses.  Patient will need coumadin level checked every 2 days while on this medication.  Bacitracin twice daily and warm compresses and follow up with ENT.  Patient verbalizes understanding and agrees to follow up  Final Clinical Impression(s) / ED Diagnoses Final diagnoses:  Cellulitis of face   Return for intractable cough, coughing up blood, fevers > 100.4 unrelieved by medication, shortness of breath, intractable vomiting, chest pain, shortness of breath,  weakness, numbness, changes in speech, facial asymmetry, abdominal pain, passing out, Inability to tolerate liquids or food, cough, altered mental status or any concerns. No signs of systemic illness or infection. The patient is nontoxic-appearing on exam and vital signs are within normal limits. I have reviewed the triage vital signs and the nursing notes. Pertinent labs & imaging results that were available during my care of the patient were reviewed by me and considered in my medical decision making (see chart for details). After history, exam, and medical workup I feel the patient has been appropriately medically screened and is safe for discharge home. Pertinent diagnoses were discussed with the patient. Patient was given return precautions.          Rx / DC Orders ED Discharge Orders          Ordered    cephALEXin (KEFLEX) 500 MG capsule  4 times daily        02/16/21 2315    doxycycline (VIBRAMYCIN) 100 MG capsule  2 times daily        02/16/21 2315             Seena Face, MD 02/16/21 2344

## 2021-02-16 NOTE — ED Triage Notes (Signed)
Pt to ED with c/o abscess to her chin which began 5 days ago. Pt states it has progressively gotten bigger and swollen over the past few days. States pain radiates to her jaw and ears. Pt has been taking clindamycin (NOT prescribed to her), but it hasnt gotten better. Endorses puss coming from area now. Arrives A+O, VSS, NADN.

## 2021-02-16 NOTE — Discharge Instructions (Addendum)
Use bacitracin on the face 2 times daily

## 2021-04-10 ENCOUNTER — Other Ambulatory Visit: Payer: Self-pay

## 2021-04-11 ENCOUNTER — Emergency Department (HOSPITAL_COMMUNITY)
Admission: EM | Admit: 2021-04-11 | Discharge: 2021-04-12 | Disposition: A | Discharge status: Home / Self Care | Payer: Self-pay | Attending: Emergency Medicine | Admitting: Emergency Medicine

## 2021-04-11 ENCOUNTER — Encounter (HOSPITAL_COMMUNITY): Payer: Self-pay

## 2021-04-11 ENCOUNTER — Other Ambulatory Visit: Payer: Self-pay

## 2021-04-11 DIAGNOSIS — J45909 Unspecified asthma, uncomplicated: Secondary | ICD-10-CM | POA: Insufficient documentation

## 2021-04-11 DIAGNOSIS — Z9114 Patient's other noncompliance with medication regimen: Secondary | ICD-10-CM

## 2021-04-11 DIAGNOSIS — F17210 Nicotine dependence, cigarettes, uncomplicated: Secondary | ICD-10-CM | POA: Insufficient documentation

## 2021-04-11 DIAGNOSIS — Z7951 Long term (current) use of inhaled steroids: Secondary | ICD-10-CM | POA: Insufficient documentation

## 2021-04-11 DIAGNOSIS — M79604 Pain in right leg: Secondary | ICD-10-CM | POA: Insufficient documentation

## 2021-04-11 DIAGNOSIS — Z862 Personal history of diseases of the blood and blood-forming organs and certain disorders involving the immune mechanism: Secondary | ICD-10-CM

## 2021-04-11 DIAGNOSIS — J029 Acute pharyngitis, unspecified: Secondary | ICD-10-CM | POA: Insufficient documentation

## 2021-04-11 DIAGNOSIS — M79605 Pain in left leg: Secondary | ICD-10-CM | POA: Insufficient documentation

## 2021-04-11 DIAGNOSIS — Z7901 Long term (current) use of anticoagulants: Secondary | ICD-10-CM | POA: Insufficient documentation

## 2021-04-11 DIAGNOSIS — H9202 Otalgia, left ear: Secondary | ICD-10-CM | POA: Insufficient documentation

## 2021-04-11 NOTE — ED Notes (Signed)
Pt ambulatory in ED lobby. 

## 2021-04-11 NOTE — ED Triage Notes (Signed)
Pt complains of sore throat and swelling x 3 days. Pt complains of left ear pain x 2 weeks. Pt states that she may have swimmers ear.

## 2021-04-12 ENCOUNTER — Ambulatory Visit (HOSPITAL_COMMUNITY): Admission: RE | Admit: 2021-04-12 | Payer: Self-pay | Source: Ambulatory Visit

## 2021-04-12 MED ORDER — AMOXICILLIN-POT CLAVULANATE 875-125 MG PO TABS
1.0000 | ORAL_TABLET | Freq: Once | ORAL | Status: AC
  Administered 2021-04-12: 1 via ORAL
  Filled 2021-04-12: qty 1

## 2021-04-12 MED ORDER — IBUPROFEN 200 MG PO TABS
600.0000 mg | ORAL_TABLET | Freq: Once | ORAL | Status: AC
  Administered 2021-04-12: 600 mg via ORAL
  Filled 2021-04-12: qty 3

## 2021-04-12 MED ORDER — AMOXICILLIN-POT CLAVULANATE 875-125 MG PO TABS: 1.0000 | ORAL_TABLET | Freq: Two times a day (BID) | ORAL | 0 refills | Status: DC

## 2021-04-12 NOTE — ED Provider Notes (Signed)
[facility]-EM[facility]ENCY DEPT Provider Note   CSN: [id number] Arrival date & time: 04/11/21  2143     History Chief Complaint  Patient presents with   Sore Throat   Oral Swelling    Kristy Howard is a 35 y.o. female.  Patient to ED for evaluation of left ear pain and fullness, nasal congestion and sore throat for the past 3 days. No known fever. No chest congestion, pain or SOB. She feels she has had symptoms of 'swimmers ear' for the past 3 weeks, worse with other symptoms x 3 days. No nausea or vomiting. No PCP, new to the area February of this year. Reports she is supposed to be on Coumadin with history of anti-phospholipid antibody syndrome but has not been compliant in years. She reports that since coming here in February 2022, both legs have been swelling, appear red and feel "tight". No chest or respiratory symptoms. No history of previous clots.   The history is provided by the patient. No language interpreter was used.  Sore Throat Pertinent negatives include no chest pain, no abdominal pain, no headaches and no shortness of breath.      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, antepartum (HCC) 12/09/2015   Threatened premature labor, antepartum(644.03) 12/08/2015   Supervision of high-risk pregnancy 11/10/2015   Hypothyroidism in pregnancy 11/10/2015   Anxiety  11/10/2015   Phospholipid antibody syndrome affecting pregnancy (HCC) 11/10/2015    Past Surgical History:  Procedure Laterality Date   WISDOM TOOTH EXTRACTION       OB History     Gravida  7   Para  2   Term  2   Preterm      AB  5   Living  2      SAB  4   IAB  1   Ectopic      Multiple  0   Live Births  2           Family History  Problem Relation Age of Onset   Hyperlipidemia Mother    Hypertension Mother     Social History   Tobacco Use   Smoking status: Every Day    Packs/day: 0.50    Types: Cigarettes   Smokeless tobacco: Never  Substance Use Topics   Alcohol use: No   Drug use: Yes    Types: Marijuana    Home Medications Prior to Admission medications   Medication Sig Start Date End Date Taking? Authorizing Provider  albuterol (PROVENTIL HFA;VENTOLIN HFA) 108 (90 BASE) MCG/ACT inhaler Inhale 1-2 puffs into the lungs every 6 (six) hours as needed for wheezing. 06/07/12   Reuben Likes, MD  albuterol (VENTOLIN HFA) 108 (90 Base) MCG/ACT inhaler INHALE 2 PUFFS EVERY 4-6 HOURS AS NEEDED FOR WHEEZING OR SHORTNESS OF BREATH 11/29/20 11/29/21  Dartha Lodge, FNP  amoxicillin (AMOXIL) 500 MG capsule TAKE 1 CAPSULE BY MOUTH 3 TIMES DAILY 11/29/20 11/29/21  Dartha Lodge, FNP  cephALEXin (KEFLEX) 500 MG capsule Take 1 capsule (500 mg total) by mouth 4 (four) times daily. 02/16/21   Palumbo, April, MD  cyclobenzaprine (FLEXERIL) 10 MG tablet Take 1 tablet (10 mg total) by mouth 3 (three) times daily as needed for muscle spasms. Patient not taking: Reported on 07/18/2019 02/12/16   Almon Hercules, MD  doxycycline (VIBRAMYCIN) 100 MG capsule Take 1 capsule (100 mg total) by mouth 2 (two) times daily. One po bid x 7 days 02/16/21   Palumbo, April, MD  fluticasone [facility]) 50 MCG/ACT nasal spray Place 2 sprays into both nostrils daily. 07/18/19   Couture, Cortni S, PA-C  fluticasone (FLONASE) 50 MCG/ACT nasal spray Spray two sprays into both nostrils once  a day for allergies 11/29/20     hydrOXYzine (VISTARIL) 25 MG capsule TAKE 1 CAPSULE BY MOUTH 3 TIMES DAILY AS NEEDED 10/26/20 10/26/21  Marcellina Millin, MD  lamoTRIgine (LAMICTAL) 25 MG tablet TAKE 1 TABLET BY MOUTH TWICE DAILY. 10/21/20 10/21/21  Marcellina Millin, MD  Multiple Vitamins-Minerals (AIRBORNE) TBEF Take 1 tablet by mouth daily as needed (immune system boost).    [provider]  rOPINIRole (REQUIP) 0.25 MG tablet TAKE 1 TABLET BY MOUTH AT BEDTIME FOR RESTLESS LEG 11/29/20 11/29/21  Dartha Lodge, FNP  warfarin (COUMADIN) 10 MG tablet Take 1 tablet (10 mg total) by mouth daily. Take 10mg  daily every day except for Sunday 07/18/19   Couture, Cortni S, PA-C    Allergies    Morphine and related and Vicodin [hydrocodone-acetaminophen]  Review of Systems   Review of Systems  Constitutional:  Negative for chills and fever.  HENT:  Positive for congestion, ear pain and sore throat. Negative for facial swelling, sinus pain, sneezing, trouble swallowing and voice change.   Eyes:  Negative for redness and itching.  Respiratory: Negative.  Negative for cough and shortness of breath.   Cardiovascular:  Positive for leg swelling (See HPI.). Negative for chest pain.  Gastrointestinal: Negative.  Negative for abdominal pain, nausea and vomiting.  Musculoskeletal: Negative.   Skin:  Positive for color change (Bilateral LE's.).  Neurological: Negative.  Negative for numbness and headaches.   Physical Exam Updated Vital Signs BP 122/78 (BP Location: Right Arm)   Pulse 85   Temp 98.2 F (36.8 C) (Oral)   Resp 18   Ht 5\' 8"  (1.727 m)   Wt 100.2 kg   SpO2 100%   BMI 33.60 kg/m   Physical Exam Vitals and nursing note reviewed.  Constitutional:      Appearance: She is well-developed.  HENT:     Head: Normocephalic.     Right Ear: Tympanic membrane and ear canal normal.     Ears:     Comments: Left external canal clear, no cerumen, swelling or discharge. TM is  erythematous, dull. No purulent middle ear effusion. No bulging. No pre- or post-auricular nodes, redness or tenderness.     Nose: No congestion or rhinorrhea.     Mouth/Throat:     Mouth: Mucous membranes are moist.     Pharynx: Uvula midline. Pharyngeal swelling and posterior oropharyngeal erythema present. No oropharyngeal exudate.  Cardiovascular:     Rate and Rhythm: Normal rate and regular rhythm.     Heart sounds: No murmur heard. Pulmonary:     Effort: Pulmonary effort is normal.  Breath sounds: Normal breath sounds. No wheezing, rhonchi or rales.  Abdominal:     General: Bowel sounds are normal.     Palpations: Abdomen is soft.     Tenderness: There is no abdominal tenderness. There is no guarding or rebound.  Musculoskeletal:        General: Normal range of motion.     Cervical back: Normal range of motion and neck supple.     Comments: Lower legs bilaterally appear red. No warmth. No pretibial edema or pitting. Distal pulses are present. No tenderness specific to calves. FROM. Legs normal above the knees.   Skin:    General: Skin is warm and dry.  Neurological:     General: No focal deficit present.     Mental Status: She is alert and oriented to person, place, and time.    ED Results / Procedures / Treatments   Labs (all labs ordered are listed, but only abnormal results are displayed) Labs Reviewed - No data to display  EKG None  Radiology No results found.  Procedures Procedures   Medications Ordered in ED Medications - No data to display  ED Course  I have reviewed the triage vital signs and the nursing notes.  Pertinent labs & imaging results that were available during my care of the patient were reviewed by me and considered in my medical decision making (see chart for details).    MDM Rules/Calculators/A&P                           Patient to ED with ss/sxs as per HPI.   She is well appearing, no ill or toxic. VSS, afebrile. Exam findings  suggest left ear infection. Despite being afebrile, will treat with abx.   There is bilateral LE swelling and redness with subjective pain and "tightness" when walking. H/O disorder predisposing her to clots. No history of same. Not on Coumadin. No tachycardia, chest pain or hypoxia to cause concern for pulmonary infection or PE. Will obtain doppler studies in am. Strongly encouraged PCP follow up to resume coumadin therapy.      Final Clinical Impression(s) / ED Diagnoses Final diagnoses:  None   Pharyngitis Bilateral LE pain At risk for blood clots  Rx / DC Orders ED Discharge Orders     None        Danne Harbor 04/12/21 0117    Dione Booze, MD 04/12/21 [phone]

## 2021-04-12 NOTE — Discharge Instructions (Addendum)
Take the medication as prescribed. Recommend ibuprofen 600 mg (3 over the counter strength tablets) every 6 hours for comfort. Push fluids.   Return in the morning as instructed in your discharge papers to have doppler studies performed on your lower legs to insure there is no clot burden.

## 2021-06-16 ENCOUNTER — Telehealth: Payer: Self-pay

## 2022-01-22 ENCOUNTER — Encounter: Payer: 59 | Admitting: Obstetrics & Gynecology

## 2023-03-11 ENCOUNTER — Emergency Department (HOSPITAL_COMMUNITY)
Admission: EM | Admit: 2023-03-11 | Discharge: 2023-03-11 | Disposition: A | Discharge status: Home / Self Care | Payer: MEDICAID | Attending: Emergency Medicine | Admitting: Emergency Medicine

## 2023-03-11 ENCOUNTER — Emergency Department (HOSPITAL_COMMUNITY): Payer: MEDICAID

## 2023-03-11 ENCOUNTER — Encounter (HOSPITAL_COMMUNITY): Payer: Self-pay | Admitting: Emergency Medicine

## 2023-03-11 DIAGNOSIS — Z79899 Other long term (current) drug therapy: Secondary | ICD-10-CM | POA: Insufficient documentation

## 2023-03-11 DIAGNOSIS — F1910 Other psychoactive substance abuse, uncomplicated: Secondary | ICD-10-CM | POA: Diagnosis not present

## 2023-03-11 DIAGNOSIS — J45909 Unspecified asthma, uncomplicated: Secondary | ICD-10-CM | POA: Diagnosis not present

## 2023-03-11 DIAGNOSIS — R55 Syncope and collapse: Secondary | ICD-10-CM | POA: Insufficient documentation

## 2023-03-11 LAB — CBC WITH DIFFERENTIAL/PLATELET
Abs Immature Granulocytes: 0.03 10*3/uL (ref 0.00–0.07)
Basophils Absolute: 0.1 10*3/uL (ref 0.0–0.1)
Basophils Relative: 1 %
Eosinophils Absolute: 0 10*3/uL (ref 0.0–0.5)
Eosinophils Relative: 0 %
HCT: 41.4 % (ref 36.0–46.0)
Hemoglobin: 12.8 g/dL (ref 12.0–15.0)
Immature Granulocytes: 0 %
Lymphocytes Relative: 15 %
Lymphs Abs: 1.2 10*3/uL (ref 0.7–4.0)
MCH: 25.8 pg — ABNORMAL LOW (ref 26.0–34.0)
MCHC: 30.9 g/dL (ref 30.0–36.0)
MCV: 83.3 fL (ref 80.0–100.0)
Monocytes Absolute: 0.6 10*3/uL (ref 0.1–1.0)
Monocytes Relative: 7 %
Neutro Abs: 6 10*3/uL (ref 1.7–7.7)
Neutrophils Relative %: 77 %
Platelets: 265 10*3/uL (ref 150–400)
RBC: 4.97 MIL/uL (ref 3.87–5.11)
RDW: 14.3 % (ref 11.5–15.5)
WBC: 7.8 10*3/uL (ref 4.0–10.5)
nRBC: 0 % (ref 0.0–0.2)

## 2023-03-11 LAB — RAPID URINE DRUG SCREEN, HOSP PERFORMED
Amphetamines: POSITIVE — AB
Barbiturates: NOT DETECTED
Benzodiazepines: NOT DETECTED
Cocaine: POSITIVE — AB
Opiates: POSITIVE — AB
Tetrahydrocannabinol: POSITIVE — AB

## 2023-03-11 LAB — COMPREHENSIVE METABOLIC PANEL
ALT: 42 U/L (ref 0–44)
AST: 36 U/L (ref 15–41)
Albumin: 4.2 g/dL (ref 3.5–5.0)
Alkaline Phosphatase: 100 U/L (ref 38–126)
Anion gap: 9 (ref 5–15)
BUN: 15 mg/dL (ref 6–20)
CO2: 26 mmol/L (ref 22–32)
Calcium: 9.2 mg/dL (ref 8.9–10.3)
Chloride: 105 mmol/L (ref 98–111)
Creatinine, Ser: 0.91 mg/dL (ref 0.44–1.00)
GFR, Estimated: >60 mL/min (ref >60)
Glucose, Bld: 89 mg/dL (ref 70–99)
Potassium: 3.6 mmol/L (ref 3.5–5.1)
Sodium: 140 mmol/L (ref 135–145)
Total Bilirubin: 0.6 mg/dL (ref 0.3–1.2)
Total Protein: 8.1 g/dL (ref 6.5–8.1)

## 2023-03-11 LAB — PREGNANCY, URINE: Preg Test, Ur: NEGATIVE

## 2023-03-11 LAB — ETHANOL: Alcohol, Ethyl (B): <10 mg/dL (ref <10)

## 2023-03-11 MED ORDER — MECLIZINE HCL 25 MG PO TABS
25.0000 mg | ORAL_TABLET | Freq: Once | ORAL | Status: AC
  Administered 2023-03-11: 25 mg via ORAL
  Filled 2023-03-11: qty 1

## 2023-03-11 MED ORDER — SODIUM CHLORIDE 0.9 % IV BOLUS
1000.0000 mL | Freq: Once | INTRAVENOUS | Status: AC
  Administered 2023-03-11: 1000 mL via INTRAVENOUS

## 2023-03-11 NOTE — ED Provider Notes (Signed)
EMERGENCY DEPARTMENT AT [facility] Provider Note   CSN: [id number] Arrival date & time: 03/11/23  1515     History  Chief Complaint  Patient presents with   Loss of Consciousness    Kristy Howard is a 37 y.o. female.  with history of anxiety, depression, ADHD, asthma, fibromyalgia, arthritis who presents to the ED for evaluation of a syncopal episode.  This was witnessed by police officers who are present at bedside.  Please officer states she was sitting down in the grass when she started to go unconscious and slumped to her left side.  She did not fall or hit her head.  Does not take any blood thinners.  She is suffering from homelessness at this time and lives outside.  She states she feels dehydrated at this time.  She has had intermittent episodes of dizziness over the last 2 days. this is typically present when she is changing positions and improved with rest.  She has some nausea but no vomiting.  She did take heroin earlier today.  She denies any other recreational drug use.  No alcohol use.  She reports a mild frontal headache.  No numbness, weakness or tingling.  She developed some low back pain when the police officers picked her up off the ground.  She denies any chest pain, shortness of breath, fevers, chills, abdominal pain.  She reports the chronic cough that is nonproductive.  No recent change.  Police state patient had a less than 1 second episode of unconsciousness.  No seizure-like activity.   Loss of Consciousness      Home Medications Prior to Admission medications   Medication Sig Start Date End Date Taking? Authorizing Provider  albuterol (PROVENTIL HFA;VENTOLIN HFA) 108 (90 BASE) MCG/ACT inhaler Inhale 1-2 puffs into the lungs every 6 (six) hours as needed for wheezing. 06/07/12   Reuben Likes, MD  albuterol (VENTOLIN HFA) 108 (90 Base) MCG/ACT inhaler INHALE 2 PUFFS EVERY 4-6 HOURS AS NEEDED FOR WHEEZING OR SHORTNESS OF BREATH  11/29/20 11/29/21  Dartha Lodge, FNP  amoxicillin-clavulanate (AUGMENTIN) 875-125 MG tablet Take 1 tablet by mouth every 12 (twelve) hours. 04/12/21   Elpidio Anis, PA-C  cephALEXin (KEFLEX) 500 MG capsule Take 1 capsule (500 mg total) by mouth 4 (four) times daily. 02/16/21   Palumbo, April, MD  cyclobenzaprine (FLEXERIL) 10 MG tablet Take 1 tablet (10 mg total) by mouth 3 (three) times daily as needed for muscle spasms. Patient not taking: Reported on 07/18/2019 02/12/16   Almon Hercules, MD  doxycycline (VIBRAMYCIN) 100 MG capsule Take 1 capsule (100 mg total) by mouth 2 (two) times daily. One po bid x 7 days 02/16/21   Palumbo, April, MD  fluticasone [facility]) 50 MCG/ACT nasal spray Place 2 sprays into both nostrils daily. 07/18/19   Couture, Cortni S, PA-C  fluticasone (FLONASE) 50 MCG/ACT nasal spray Spray two sprays into both nostrils once a day for allergies 11/29/20     lamoTRIgine (LAMICTAL) 25 MG tablet TAKE 1 TABLET BY MOUTH TWICE DAILY. 10/21/20 10/21/21  Marcellina Millin, MD  Multiple Vitamins-Minerals (AIRBORNE) TBEF Take 1 tablet by mouth daily as needed (immune system boost).    [provider]  rOPINIRole (REQUIP) 0.25 MG tablet TAKE 1 TABLET BY MOUTH AT BEDTIME FOR RESTLESS LEG 11/29/20 11/29/21  Dartha Lodge, FNP  warfarin (COUMADIN) 10 MG tablet Take 1 tablet (10 mg total) by mouth daily. Take 10mg  daily every day except for Sunday 07/18/19  Couture, Cortni S, PA-C      Allergies    Morphine and codeine and Vicodin [hydrocodone-acetaminophen]    Review of Systems   Review of Systems  Cardiovascular:  Positive for syncope.  Neurological:  Positive for syncope.  All other systems reviewed and are negative.   Physical Exam Updated Vital Signs BP 104/65   Pulse 77   Temp 97.8 F (36.6 C)   Resp 16   Ht 5\' 8"  (1.727 m)   Wt 100 kg   LMP  (LMP Unknown)   SpO2 98%   BMI 33.52 kg/m  Physical Exam Vitals and nursing note reviewed.  Constitutional:       General: She is not in acute distress.    Appearance: She is well-developed.     Comments: Appears acutely under the influence of drugs or alcohol  HENT:     Head: Normocephalic and atraumatic.  Eyes:     Conjunctiva/sclera: Conjunctivae normal.  Cardiovascular:     Rate and Rhythm: Normal rate and regular rhythm.     Heart sounds: No murmur heard. Pulmonary:     Effort: Pulmonary effort is normal. No respiratory distress.     Breath sounds: Normal breath sounds. No wheezing, rhonchi or rales.  Abdominal:     Palpations: Abdomen is soft.     Tenderness: There is no abdominal tenderness. There is no guarding.  Musculoskeletal:        General: No swelling.     Cervical back: Neck supple.  Skin:    General: Skin is warm and dry.     Capillary Refill: Capillary refill takes less than 2 seconds.  Neurological:     Mental Status: She is alert.     Comments:   MENTAL STATUS: AAOx3, appears drowsy   LANG/SPEECH: Fluent, intact naming, repetition & comprehension   CRANIAL NERVES:   II: Pupils equal and reactive   III, IV, VI: EOM intact, no gaze preference or deviation, no nystagmus   V: normal sensation of the face   VII: no facial asymmetry   VIII: normal hearing to speech   MOTOR: 5/5 in both upper and lower extremities   SENSORY: Normal to touch in all extremiteis   COORD: Normal finger to nose, heel to shin and shoulder shrug, no tremor, no dysmetria. No pronator drift  Psychiatric:        Mood and Affect: Mood normal.     ED Results / Procedures / Treatments   Labs (all labs ordered are listed, but only abnormal results are displayed) Labs Reviewed  CBC WITH DIFFERENTIAL/PLATELET - Abnormal; Notable for the following components:      Result Value   MCH 25.8 (*)    All other components within normal limits  RAPID URINE DRUG SCREEN, HOSP PERFORMED - Abnormal; Notable for the following components:   Opiates POSITIVE (*)    Cocaine POSITIVE (*)    Amphetamines POSITIVE (*)     Tetrahydrocannabinol POSITIVE (*)    All other components within normal limits  COMPREHENSIVE METABOLIC PANEL  ETHANOL  PREGNANCY, URINE    EKG None  Radiology DG Lumbar Spine Complete  Result Date: 03/11/2023 CLINICAL DATA:  Syncope EXAM: LUMBAR SPINE - COMPLETE 4+ VIEW COMPARISON:  12/21/2012 FINDINGS: There is no evidence of lumbar spine fracture. Alignment is normal. Intervertebral disc spaces are maintained. IMPRESSION: Negative. Electronically Signed   By: Charlett Nose M.D.   On: 03/11/2023 20:12   DG Chest 2 View  Result Date: 03/11/2023 CLINICAL  DATA:  Syncope EXAM: CHEST - 2 VIEW COMPARISON:  Chest radiograph dated 11/16/2012 FINDINGS: Patient is rotated to the right. Normal lung volumes. No focal consolidations. No pleural effusion or pneumothorax. The heart size and mediastinal contours are within normal limits. No acute osseous abnormality. IMPRESSION: No active cardiopulmonary disease. Electronically Signed   By: Agustin Cree M.D.   On: 03/11/2023 20:11   CT Head Wo Contrast  Result Date: 03/11/2023 CLINICAL DATA:  Syncope/presyncope EXAM: CT HEAD WITHOUT CONTRAST TECHNIQUE: Contiguous axial images were obtained from the base of the skull through the vertex without intravenous contrast. RADIATION DOSE REDUCTION: This exam was performed according to the departmental dose-optimization program which includes automated exposure control, adjustment of the mA and/or kV according to patient size and/or use of iterative reconstruction technique. COMPARISON:  10/12/2012 FINDINGS: Brain: No acute intracranial findings are seen. There are no signs of bleeding within the cranium. Ventricles are not dilated. There is no focal edema or mass effect. Vascular: Unremarkable. Skull: No fracture is seen in calvarium. Sinuses/Orbits: There is mild mucosal thickening in ethmoid sinus. Other: None. IMPRESSION: No acute intracranial findings are seen in noncontrast CT brain. Electronically Signed   By:  Ernie Avena M.D.   On: 03/11/2023 19:28    Procedures Procedures    Medications Ordered in ED Medications  meclizine (ANTIVERT) tablet 25 mg (25 mg Oral Given 03/11/23 1830)  sodium chloride 0.9 % bolus 1,000 mL (0 mLs Intravenous Stopped 03/11/23 2207)    ED Course/ Medical Decision Making/ A&P                       [facility] Syncope Rule Score: 0      Medical Decision Making Amount and/or Complexity of Data Reviewed Labs: ordered. Radiology: ordered.  This patient presents to the ED for concern of witnessed syncopal episode, this involves an extensive number of treatment options, and is a complaint that carries with it a high risk of complications and morbidity.  The differential for syncope is extensive and includes, but is not limited to: arrythmia (Vtach, SVT, SSS, sinus arrest, AV block, bradycardia) aortic stenosis, AMI, HOCM, PE, atrial myxoma, pulmonary hypertension, orthostatic hypotension, (hypovolemia, drug effect, GB syndrome, micturition, cough, swall) carotid sinus sensitivity, Seizure, TIA/CVA, hypoglycemia,  Vertigo.   Co morbidities that complicate the patient evaluation  anxiety, depression, ADHD, asthma, fibromyalgia, arthritis  My initial workup includes  Additional history obtained from: Nursing notes from this visit. Police provide a portion of the history  I ordered, reviewed and interpreted labs which include: CBC, CMP, ethanol, UPT, UDS  I ordered imaging studies including CT head, chest x ray, lumbar x ray I independently visualized and interpreted imaging which showed negative imaging I agree with the radiologist interpretation  Cardiac Monitoring:  The patient was maintained on a cardiac monitor.  I personally viewed and interpreted the cardiac monitored which showed an underlying rhythm of: NSR  Afebrile, hemodynamically stable.  37 year old female presents to the ED for evaluation of a syncopal episode.  She did not appear acutely  under the influence on initial evaluation, however it appears she has metabolized and is acting more towards her baseline.  Her syncopal episode was observed by police.  It lasted less than 1 minute.  There is no seizure-like activity.  Syncope is favored to be secondary to her recreational drug use.  She admits to using heroin today, however her UDS is also positive for cocaine, amphetamines and THC.  She  reports intermittent dizziness over the last 2 days and feels dehydrated.  She was given meclizine and IV fluids in the ED and reported significant improvement in her symptoms.  There are no audible murmurs on exam.  No adventitious breath sounds.  No anemia.  [place] syncope rule score of 0.  I have low suspicion for cardiogenic or pulmonary causes of her syncope. Patient ambulated in the ED by myself without difficulty. She was encouraged to discontinue her recreational drug use. She was given [employer] for housing and financial assistance. She was given return precautions. Stable at discharge.   At this time there does not appear to be any evidence of an acute emergency medical condition and the patient appears stable for discharge with appropriate outpatient follow up. Diagnosis was discussed with patient who verbalizes understanding of care plan and is agreeable to discharge. I have discussed return precautions with patient who verbalizes understanding. Patient encouraged to follow-up with their PCP within 1 week. All questions answered.  Note: Portions of this report may have been transcribed using voice recognition software. Every effort was made to ensure accuracy; however, inadvertent computerized transcription errors may still be present.        Final Clinical Impression(s) / ED Diagnoses Final diagnoses:  Syncope and collapse  Polysubstance abuse [facility])    Rx / DC Orders ED Discharge Orders     None         Mora Bellman 03/11/23 2215    Arby Barrette, MD 03/14/23 1334

## 2023-03-11 NOTE — Discharge Instructions (Signed)
You have been seen today for your complaint of syncope. Your lab work  was reassuring. Your imaging was reassuring. Home care instructions are as follows:  Avoid drugs. Drink plenty of water. Stay out of the sun.  Follow up with: your PCP as soon as possible Please seek immediate medical care if you develop any of the following symptoms: You faint. You hit your head or are injured after fainting. You have any of these symptoms that may indicate trouble with your heart: Fast or irregular heartbeats (palpitations). Unusual pain in your chest, abdomen, or back. Shortness of breath. You have a seizure. You have a severe headache. You are confused. You have vision problems. You have severe weakness or trouble walking. You are bleeding from your mouth or rectum, or you have black or tarry stool. At this time there does not appear to be the presence of an emergent medical condition, however there is always the potential for conditions to change. Please read and follow the below instructions.  Do not take your medicine if  develop an itchy rash, swelling in your mouth or lips, or difficulty breathing; call 911 and seek immediate emergency medical attention if this occurs.  You may review your lab tests and imaging results in their entirety on your MyChart account.  Please discuss all results of fully with your primary care provider and other specialist at your follow-up visit.  Note: Portions of this text may have been transcribed using voice recognition software. Every effort was made to ensure accuracy; however, inadvertent computerized transcription errors may still be present.

## 2023-03-11 NOTE — ED Triage Notes (Signed)
Pt arrives via EMS in police custody from a parking lot. Pt was face down when she had a syncopal episode. Reports drug use today.   122/65 90 HR 157 CBG

## 2023-03-11 NOTE — ED Provider Triage Note (Cosign Needed Addendum)
Emergency Medicine Provider Triage Evaluation Note  RAINN IMPARATO , a 37 y.o. female  was evaluated in triage.  Pt complains of syncope. She was sitting in the grass and feel over according to the officer at bedside. She was out for for about 1-2 minutes.  Review of Systems  Positive: Headache, neck stiffness, low back pain, nausea Negative: Vomiting  Physical Exam  BP (!) 160/105 (BP Location: Right Arm)   Pulse (!) 114   Temp 98.3 F (36.8 C) (Oral)   Resp (!) 22   SpO2 100%  Gen:   Awake, no distress   Resp:  Normal effort  MSK:   Moves extremities without difficulty  Other:    Medical Decision Making  Medically screening exam initiated at 3:24 PM.  Appropriate orders placed.  POLET ALLGOOD was informed that the remainder of the evaluation will be completed by another provider, this initial triage assessment does not replace that evaluation, and the importance of remaining in the ED until their evaluation is complete.    Maxwell Marion, PA-C 03/11/23 1533    Maxwell Marion, PA-C 03/11/23 1535

## 2023-06-10 ENCOUNTER — Inpatient Hospital Stay: Payer: MEDICAID

## 2023-06-10 ENCOUNTER — Inpatient Hospital Stay: Payer: MEDICAID | Attending: Hematology and Oncology | Admitting: Hematology and Oncology

## 2023-06-18 ENCOUNTER — Telehealth: Payer: Self-pay | Admitting: Oncology

## 2023-06-18 NOTE — Telephone Encounter (Signed)
Scheduled appointments per referral. Patient is aware of the appointment time and date as well as the address. Patient was informed to arrive 10-15 minutes prior with updated insurance information. All questions were answered.

## 2023-07-11 ENCOUNTER — Telehealth: Payer: Self-pay

## 2023-07-11 ENCOUNTER — Inpatient Hospital Stay: Payer: MEDICAID

## 2023-07-11 ENCOUNTER — Inpatient Hospital Stay: Payer: MEDICAID | Attending: Hematology and Oncology | Admitting: Oncology

## 2023-07-11 NOTE — Progress Notes (Deleted)
[facility]  HEMATOLOGY CLINIC CONSULTATION NOTE    PATIENT NAME: Kristy Howard   MR#: [id number] DOB: [birth year]  DATE OF SERVICE: 07/11/2023   REFERRING PHYSICIAN  ***  Patient Care Team: Norm Salt, [place] as PCP - General (Physician Assistant)   REASON FOR CONSULTATION/ CHIEF COMPLAINT:  History of antiphospholipid antibody syndrome  HISTORY OF PRESENT ILLNESS:  VIVAN Howard is a 37 y.o. female, with a past medical history of ***, was referred to our service for evaluation of ***.    Discussed the use of AI scribe software for clinical note transcription with the patient, who gave verbal consent to proceed.   Patient apparently has history of antiphospholipid antibody syndrome.  She was started on low-dose aspirin by her PCP and referred to [place] for additional recommendations.  She denies fever, cough, diarrhea, or other infectious symptoms.  She denies epistaxis, bloody stool, melena, hematuria, bruising or other bleeding symptoms. She also denies unintentional weight loss, night sweats or other constitutional symptoms.  MEDICAL HISTORY Past Medical History:  Diagnosis Date   Anti-phospholipid antibody syndrome (HCC)    Anxiety    Arthritis    Asthma    Attention deficit disorder (ADD)    Back pain    Chronic bronchitis (HCC)    Depression    Fibromyalgia    Neck pain    Pregnancy complicated by subutex maintenance, antepartum (HCC)    Thyroid disease      SURGICAL HISTORY Past Surgical History:  Procedure Laterality Date   WISDOM TOOTH EXTRACTION       SOCIAL HISTORY: She reports that she has been smoking cigarettes. She has never used smokeless tobacco. She reports current drug use. Drug: Marijuana. She reports that she does not drink alcohol. Social History   Socioeconomic History   Marital status: Divorced    Spouse name: Not on file   Number of children: Not on file   Years of education: Not on file   Highest  education level: Not on file  Occupational History   Not on file  Tobacco Use   Smoking status: Every Day    Current packs/day: 0.50    Types: Cigarettes   Smokeless tobacco: Never  Substance and Sexual Activity   Alcohol use: No   Drug use: Yes    Types: Marijuana   Sexual activity: Yes    Birth control/protection: None  Other Topics Concern   Not on file  Social History Narrative   Not on file   Social Determinants of Health   Financial Resource Strain: Not on file  Food Insecurity: Not on file  Transportation Needs: Not on file  Physical Activity: Not on file  Stress: Not on file  Social Connections: Not on file  Intimate Partner Violence: Not on file    FAMILY HISTORY: Her family history includes Hyperlipidemia in her mother; Hypertension in her mother.  CURRENT MEDICATIONS   Current Outpatient Medications  Medication Instructions   albuterol (PROVENTIL HFA;VENTOLIN HFA) 108 (90 BASE) MCG/ACT inhaler 1-2 puffs, Inhalation, Every 6 hours PRN   albuterol (VENTOLIN HFA) 108 (90 Base) MCG/ACT inhaler INHALE 2 PUFFS EVERY 4-6 HOURS AS NEEDED FOR WHEEZING OR SHORTNESS OF BREATH   amoxicillin-clavulanate (AUGMENTIN) 875-125 MG tablet 1 tablet, Oral, Every 12 hours   cephALEXin (KEFLEX) 500 mg, Oral, 4 times daily   cyclobenzaprine (FLEXERIL) 10 mg, Oral, 3 times daily PRN   doxycycline (VIBRAMYCIN) 100 mg, Oral, 2 times daily, One po bid  x 7 days   fluticasone (FLONASE) 50 MCG/ACT nasal spray 2 sprays, Each Nare, Daily   fluticasone (FLONASE) 50 MCG/ACT nasal spray Spray two sprays into both nostrils once a day for allergies   lamoTRIgine (LAMICTAL) 25 MG tablet TAKE 1 TABLET BY MOUTH TWICE DAILY.   Multiple Vitamins-Minerals (AIRBORNE) TBEF 1 tablet, Oral, Daily PRN   rOPINIRole (REQUIP) 0.25 MG tablet TAKE 1 TABLET BY MOUTH AT BEDTIME FOR RESTLESS LEG   warfarin (COUMADIN) 10 mg, Oral, Daily, Take 10mg  daily every day except for Sunday     ALLERGIES  She is allergic  to morphine and codeine and vicodin [hydrocodone-acetaminophen].  REVIEW OF SYSTEMS:  Review of Systems - Oncology   Rest of the pertinent review of systems is unremarkable except as mentioned above in HPI.  PHYSICAL EXAMINATION:  ECOG PERFORMANCE STATUS: {CHL ONC ECOG PS:[id number]}  There were no vitals filed for this visit. There were no vitals filed for this visit.  Physical Exam  ***  LABORATORY DATA:   I have reviewed the data as listed.  No results found for any visits on 07/11/23.  Lab Results  Component Value Date   WBC 7.8 03/11/2023   NEUTROABS 6.0 03/11/2023   HGB 12.8 03/11/2023   HCT 41.4 03/11/2023   MCV 83.3 03/11/2023   PLT 265 03/11/2023    No results found for: "IRON", "TIBC", "FERRITIN"   Recent Labs    03/11/23 1603  NA 140  K 3.6  CL 105  CO2 26  GLUCOSE 89  BUN 15  CREATININE 0.91  CALCIUM 9.2  GFRNONAA >60  PROT 8.1  ALBUMIN 4.2  AST 36  ALT 42  ALKPHOS 100  BILITOT 0.6    No results found for this or any previous visit (from the past 72 hour(s)).  RADIOGRAPHIC STUDIES:  I have personally reviewed the radiological images as listed and agreed with the findings in the report.  No results found.  ASSESSMENT & PLAN:  No problem-specific Assessment & Plan notes found for this encounter.   We will check a CBC with differential today.  We will also send a work-up for leukocytosis, including ESR, CRP, BCR /ABL, peripheral blood flow cytometry.  We will send the work-up for anemia, including ferritin, iron studies, vitamin B12 and folate level, MMA, SPEP and IFE, haptoglobin, reticulocyte count,  I have counseled patient on smoking cessation, and offered nicotine patch, patient states that sheis going to try to quit.  Low-dose CT scan for screening of lung cancer is indicated ***  Patient will come back in *** weeks to review the test results and discuss further plan of care.  No orders of the defined types were placed in  this encounter.   The total time spent in the appointment was {CHL ONC TIME VISIT - XBJYN:[id number]} encounter with the patient, including review of chart and results of various tests, discussion about plan of care and coordination of care plan.  I reviewed lab results and outside records for this visit and discussed relevant results with the patient. Diagnosis, plan of care and treatment options were also discussed in detail with the patient. Opportunity provided to ask questions and answers provided to her apparent satisfaction. Provided instructions to call our clinic with any problems, questions or concerns prior to return visit. I recommended to continue follow-up with PCP and sub-specialists. She verbalized understanding and agreed with the plan. No barriers to learning was detected.   Future Appointments  Date Time Provider Department Center  07/11/2023  1:00 PM Isair Inabinet, Archie Patten, MD [facility] None  07/11/2023  2:00 PM [facility] LAB [facility] None     Lasasha Brophy, MD  07/11/2023 9:44 AM  [facility] - A DEPT OF [facility] [address] [place] [address] Dept: [phone] Dept Fax: [phone]    This document was completed utilizing speech recognition software. Grammatical errors, random word insertions, pronoun errors, and incomplete sentences are an occasional consequence of this system due to software limitations, ambient noise, and hardware issues. Any formal questions or concerns about the content, text or information contained within the body of this dictation should be directly addressed to the provider for clarification.

## 2023-07-11 NOTE — Telephone Encounter (Signed)
Tried to call patient to see if she was coming to her appointment today but the phone was not in service. Sent message to scheduling to have it rescheduled. I no showed the patient.

## 2023-12-19 DIAGNOSIS — D352 Benign neoplasm of pituitary gland: Secondary | ICD-10-CM | POA: Insufficient documentation

## 2023-12-19 DIAGNOSIS — J4520 Mild intermittent asthma, uncomplicated: Secondary | ICD-10-CM | POA: Insufficient documentation

## 2024-01-15 ENCOUNTER — Other Ambulatory Visit: Payer: Self-pay

## 2024-03-20 ENCOUNTER — Other Ambulatory Visit: Payer: Self-pay

## 2024-05-19 ENCOUNTER — Other Ambulatory Visit: Payer: Self-pay

## 2024-05-19 ENCOUNTER — Encounter (HOSPITAL_COMMUNITY): Payer: Self-pay | Admitting: Obstetrics & Gynecology

## 2024-05-19 ENCOUNTER — Inpatient Hospital Stay (HOSPITAL_COMMUNITY)
Admission: EM | Admit: 2024-05-19 | Discharge: 2024-05-19 | Disposition: A | Discharge status: Home / Self Care | Payer: MEDICAID | Attending: Obstetrics & Gynecology | Admitting: Obstetrics & Gynecology

## 2024-05-19 DIAGNOSIS — Z3492 Encounter for supervision of normal pregnancy, unspecified, second trimester: Secondary | ICD-10-CM | POA: Diagnosis not present

## 2024-05-19 DIAGNOSIS — R300 Dysuria: Secondary | ICD-10-CM | POA: Diagnosis not present

## 2024-05-19 DIAGNOSIS — Z113 Encounter for screening for infections with a predominantly sexual mode of transmission: Secondary | ICD-10-CM | POA: Diagnosis not present

## 2024-05-19 DIAGNOSIS — O26892 Other specified pregnancy related conditions, second trimester: Secondary | ICD-10-CM | POA: Diagnosis not present

## 2024-05-19 DIAGNOSIS — R140 Abdominal distension (gaseous): Secondary | ICD-10-CM | POA: Diagnosis present

## 2024-05-19 HISTORY — DX: Headache, unspecified: R51.9

## 2024-05-19 HISTORY — DX: Unspecified ovarian cyst, unspecified side: N83.209

## 2024-05-19 HISTORY — DX: Urinary tract infection, site not specified: N39.0

## 2024-05-19 HISTORY — DX: Calculus of kidney: N20.0

## 2024-05-19 HISTORY — DX: Benign neoplasm of pituitary gland: D35.2

## 2024-05-19 HISTORY — DX: Anemia, unspecified: D64.9

## 2024-05-19 HISTORY — DX: Restless legs syndrome: G25.81

## 2024-05-19 LAB — WET PREP, GENITAL
Sperm: NONE SEEN
Trich, Wet Prep: NONE SEEN
WBC, Wet Prep HPF POC: <10 (ref <10)
Yeast Wet Prep HPF POC: NONE SEEN

## 2024-05-19 LAB — CBC
HCT: 37 % (ref 36.0–46.0)
Hemoglobin: 11.8 g/dL — ABNORMAL LOW (ref 12.0–15.0)
MCH: 27.3 pg (ref 26.0–34.0)
MCHC: 31.9 g/dL (ref 30.0–36.0)
MCV: 85.5 fL (ref 80.0–100.0)
Platelets: 260 K/uL (ref 150–400)
RBC: 4.33 MIL/uL (ref 3.87–5.11)
RDW: 13.3 % (ref 11.5–15.5)
WBC: 7 K/uL (ref 4.0–10.5)
nRBC: 0 % (ref 0.0–0.2)

## 2024-05-19 LAB — URINALYSIS, ROUTINE W REFLEX MICROSCOPIC
Bilirubin Urine: NEGATIVE
Glucose, UA: NEGATIVE mg/dL
Hgb urine dipstick: NEGATIVE
Ketones, ur: NEGATIVE mg/dL
Nitrite: NEGATIVE
Protein, ur: 30 mg/dL — AB
Specific Gravity, Urine: 1.019 (ref 1.005–1.030)
pH: 6 (ref 5.0–8.0)

## 2024-05-19 LAB — COMPREHENSIVE METABOLIC PANEL WITH GFR
ALT: 15 U/L (ref 0–44)
AST: 18 U/L (ref 15–41)
Albumin: 2.5 g/dL — ABNORMAL LOW (ref 3.5–5.0)
Alkaline Phosphatase: 95 U/L (ref 38–126)
Anion gap: 11 (ref 5–15)
BUN: 7 mg/dL (ref 6–20)
CO2: 21 mmol/L — ABNORMAL LOW (ref 22–32)
Calcium: 8.2 mg/dL — ABNORMAL LOW (ref 8.9–10.3)
Chloride: 104 mmol/L (ref 98–111)
Creatinine, Ser: 0.73 mg/dL (ref 0.44–1.00)
GFR, Estimated: >60 mL/min (ref >60)
Glucose, Bld: 107 mg/dL — ABNORMAL HIGH (ref 70–99)
Potassium: 3.2 mmol/L — ABNORMAL LOW (ref 3.5–5.1)
Sodium: 136 mmol/L (ref 135–145)
Total Bilirubin: <0.2 mg/dL (ref 0.0–1.2)
Total Protein: 6.2 g/dL — ABNORMAL LOW (ref 6.5–8.1)

## 2024-05-19 LAB — HIV ANTIBODY (ROUTINE TESTING W REFLEX): HIV Screen 4th Generation wRfx: NONREACTIVE

## 2024-05-19 LAB — LIPASE, BLOOD: Lipase: 22 U/L (ref 11–51)

## 2024-05-19 LAB — HCG, SERUM, QUALITATIVE: Preg, Serum: POSITIVE — AB

## 2024-05-19 NOTE — MAU Note (Signed)
 Kristy Howard is a 38 y.o. at Unknown here in MAU reporting: very surprised,  hadn't had a period in over a year. Hx of pituitary tumor and several other medical problems.  Came to ER today due to bloated, tight stomach, feeling full, some cramping. Had some burning with urination earlier, not where it comes out-  up higher. No bleeding or d/c. Abd soft on palpation. No guarding. Onset of complaint: past wk with the cramping.  Pain score: m/m Vitals:   05/19/24 0634 05/19/24 0838  BP:  124/70  Pulse:  89  Resp: 15 16  Temp:    SpO2:  100%     FHT:150 Lab orders placed from triage:  vag swabs

## 2024-05-19 NOTE — ED Provider Triage Note (Signed)
 Emergency Medicine Provider OB Triage Evaluation Note  Kristy Howard is a 38 y.o. female, [phone], at Unknown gestation who presents to the emergency department with complaints of abdominal and distention. LMP April + preg.  Review of  Systems  Positive: distension Negative: pain   Physical Exam  BP 130/84 (BP Location: Right Arm)   Pulse 98   Temp 98.1 F (36.7 C) (Oral)   Resp 15   Ht 5' 8 (1.727 m)   SpO2 100%   BMI 33.52 kg/m  General: Awake, no distress  HEENT: Atraumatic  Resp: Normal effort  Cardiac: Normal rate Abd: Gravid MSK: Moves all extremities without difficulty Neuro: Speech clear  Medical Decision Making  Pt evaluated for pregnancy concern and is stable for transfer to [facility]U. Pt is in agreement with plan for transfer.  8:01 AM Discussed with MAU APP, Shay, who accepts patient in transfer.  Clinical Impression  No diagnosis found.  US  Pos 18-20 week fetus   Arloa Chroman, PA-C 05/19/24 0805

## 2024-05-19 NOTE — ED Triage Notes (Addendum)
 Patient states she has been having some weird things with my stomach. She states she has not had a menstrual cycle in a year. States she has had abdominal distention. Unsure if it is gas or indigestion. Patient has 3 kids. Patient has irregular menstrual cycles. Patient also reports burning when she voids and swelling in the bilateral feet have caused blisters at times.

## 2024-05-19 NOTE — Discharge Instructions (Signed)
 Dos and Don'ts in Pregnancy  1. Prenatal Vitamins Pregnant women should consume the following each day through diet or supplements: o Folic acid 400-800 micrograms  o Iron 30 mg (or be screened for anemia) o Vitamin D 600 international units o Calcium 1,000 mg Prenatal vitamins are unlikely to be harmful. Therefore, they may be used to ensure adequate consumption of several vitamins and minerals in pregnancy. However, their necessity for all pregnant women is uncertain, especially for women with well-balanced diets. There is no known ideal formulation for a prenatal vitamin.  2. Nutrition and Weight Gain Pregnant women should eat a healthy, well-balanced diet and typically should increase their caloric intake by a small amount (350-450 calories/d). Typical weight gain goals vary based on pre-pregnancy body mass index (BMI)  3. Alcohol The exact threshold between safe and unsafe consumption of alcohol is unknown. Therefore, alcohol should be avoided in pregnancy.  4. Artificial Sweeteners Artificial sweeteners can be used in pregnancy. Data is conflicting. Low (typical) consumption of saccharin is likely safe.  5. Caffeine Low-to-moderate caffeine intake in pregnancy does not appear to be associated with any adverse outcomes. Pregnant women may have caffeine but should probably limit it to less than 300 mg/d (a typical 8-ounce cup of brewed coffee has approximately 130 mg of caffeine. An 8-ounce cup of tea or 12-ounce soda has approximately 50 mg of caffeine), but exact amounts vary based on the specific beverage or food.  6. Fish Consumption Pregnant women should try to consume two to three servings per week of fish with a high DHA (docosahexaenoic acid) and low mercury content. In line with current recommendations, pregnant women should generally avoid undercooked fish. However, sushi that was prepared in a clean and reputable establishment is unlikely to pose a risk to the  pregnancy.  7. Other Foods to Avoid Pregnant women should avoid raw and undercooked meat. Pregnant women should wash vegetables and fruit before eating them. Pregnant women should avoid unpasteurized dairy products. Unheated deli meats could also potentially increase the risk of Listeria, but the risk in recent years in uncertain. Pregnant women should avoid foods that are being recalled for possible Listeria contamination.  8. Smoking, Nicotine , and Vaping Women should not smoke cigarettes during pregnancy. If they are unable to quit entirely, they should reduce it as much as possible. Nicotine  replacement (with patches or gum) is appropriate as part of a smoking cessation strategy.  9. Marijuana Marijuana use is not known to be associated with any adverse outcomes in pregnancy. However, data regarding long-term neurodevelopmental outcomes are lacking; therefore, marijuana use is currently not recommended in pregnancy.  10. Exercise and Bedrest Pregnant women are encouraged to exercise regularly. There is no known benefit to activity restriction or bedrest for pregnant women.  11. Avoiding Injury Pregnant women should wear lap and shoulder seatbelts while in a motor vehicle and should not disable their airbags.  12. Oral Health Oral health and dental procedures can continue as scheduled during pregnancy.  13. Hot Tubs and Swimming Although data are limited, pregnant women should probably avoid hot tub use in the first trimester. Swimming pool use should not be discouraged in pregnancy.  14. Insect Repellants Topical insect repellants (including DEET) can be used in pregnancy and should be used in areas with high risk for insect-borne illnesses.  15. Hair Dyes Although data are limited, because systemic absorption is minimal, hair dye is presumed to be safe in pregnancy.  16. Travel Airline travel is safe in pregnancy.  Pregnant women should be familiar with the infection  exposures and available medical care for each specific destination. There is no exact gestational age at which women must stop travel. Each pregnant woman must balance the benefit of the trip with the potential of a complication at her destination.  17. Sexual Intercourse Pregnant women without bleeding, placenta previa at greater than 20 weeks of gestation, or ruptured membranes should not have restrictions regarding sexual intercourse.  18. Sleeping Position It is currently unknown whether, and at what gestational age, pregnant women should be advised to sleep on their side.  Source: Brinda Rankin EDISON MD. Dos and Don'ts in Pregnancy: Truths and Myths. Obstetrics & Gynecology 131(4):p 286-278, April 2018.  DOI: 10.1097/AOG.9999999999997482  Safe Medications in Pregnancy   Acne: Benzoyl Peroxide Salicylic Acid  Pain/Headache: Tylenol : 2 regular strength every 4 hours OR              2 Extra strength every 6 hours  Colds/Coughs/Allergies: Benadryl  (alcohol free) 25 mg every 6 hours as needed  Breath right strips  Claritin - good for allergies, also decreases swelling contributing to nasal congestion Cepacol throat lozenges  Chloraseptic throat spray  Cold-Eeze - up to three times per day  Cough drops, alcohol free  Flonase  (by prescription only) - opens nasal canal to make breathing easier. Especially effective for allergies. Guaifenesin (Mucinex) - thins out mucus for your body to clear faster. Be sure to drink a lot of water to avoid dry eyes, mouth, etc. Robitussin DM (plain only, alcohol free) - includes guaifenesin for mucus and a cough suppressant Saline nasal spray/drops  Sudafed (pseudoephedrine) & Actifed * use only after [redacted] weeks gestation and if you do not have high blood pressure  Tylenol  - good for aches and pains Vicks Vaporub - opens up nasal passages to make breathing easier Zinc lozenges  Zyrtec - good for allergies, also decreases swelling contributing to nasal  congestion  Constipation: Colace Ducolax suppositories Fleet enema Glycerin  suppositories Metamucil Milk of magnesia Miralax - can take once daily to start. If needed, can take twice daily. Senokot Smooth move tea  Diarrhea: Kaopectate Imodium A-D  *NO pepto Bismol  Hemorrhoids: Anusol Anusol HC Preparation H Tucks  Indigestion: Tums Maalox Mylanta Cimetidine (Tagamet HB)** preferred in pregnancy Famotidine (Pepcid) Ranitidine (Zantac)  Insomnia: Benadryl  (alcohol free) 25mg  every 6 hours as needed Tylenol  PM Unisom, no Gelcaps  Leg Cramps: Tums MagGel  Nausea/Vomiting:  Bonine Dramamine Emetrol Ginger extract Sea bands Meclizine    Nausea medication to take during pregnancy:  Unisom (doxylamine succinate 25 mg tablets) Take one tablet daily at bedtime. If symptoms are not adequately controlled, the dose can be increased to a maximum recommended dose of two tablets daily (1/2 tablet in the morning, 1/2 tablet mid-afternoon and one at bedtime). Vitamin B6 100mg  tablets. Take one tablet twice a day (up to 200 mg per day).  Skin Rashes: Aveeno products Benadryl  cream or 25mg  every 6 hours as needed Calamine Lotion 1% cortisone cream  Yeast infection: Gyne-lotrimin 7 Monistat 7   **If taking multiple medications, please check labels to avoid duplicating the same active ingredients **take medication as directed on the label ** Do not exceed 4000 mg of tylenol  in 24 hours **Do not take medications that contain aspirin  or ibuprofen 

## 2024-05-19 NOTE — MAU Provider Note (Signed)
 None     [name] is a 38 y.o. [id number] female at Unknown who presents to MAU today after transfer from the ED for abdominal distension and weird things with my stomach. She reports LMP was over a year ago. She was worked up in main ED for abdominal pain with positive pregnancy test and bedside US  showing 18-20 week fetus. She reports dysuria and feet swelling as well.  Patient did not know she was pregnant, not established with OBGYN.  Pertinent items noted in HPI and remainder of comprehensive ROS otherwise negative.   O BP 124/70 (BP Location: Right Arm)   Pulse 89   Temp 98.1 F (36.7 C) (Oral)   Resp 16   Ht 5' 8 (1.727 m)   Wt 101.7 kg   SpO2 100%   Breastfeeding Unknown   BMI 34.10 kg/m  FHT: 150  Physical Exam Vitals reviewed.  Constitutional:      General: She is not in acute distress.    Appearance: She is well-developed. She is not diaphoretic.  Eyes:     General: No scleral icterus. Pulmonary:     Effort: Pulmonary effort is normal. No respiratory distress.  Abdominal:     Comments: Gravid  Skin:    General: Skin is warm and dry.  Neurological:     Mental Status: She is alert.     Coordination: Coordination normal.    Results for orders placed or performed during the hospital encounter of 05/19/24 (from the past 24 hours)  Lipase, blood     Status: None   Collection Time: 05/19/24  6:52 AM  Result Value Ref Range   Lipase 22 11 - 51 U/L  Comprehensive metabolic panel     Status: Abnormal   Collection Time: 05/19/24  6:52 AM  Result Value Ref Range   Sodium 136 135 - 145 mmol/L   Potassium 3.2 (L) 3.5 - 5.1 mmol/L   Chloride 104 98 - 111 mmol/L   CO2 21 (L) 22 - 32 mmol/L   Glucose, Bld 107 (H) 70 - 99 mg/dL   BUN 7 6 - 20 mg/dL   Creatinine, Ser 9.26 0.44 - 1.00 mg/dL   Calcium 8.2 (L) 8.9 - 10.3 mg/dL   Total Protein 6.2 (L) 6.5 - 8.1 g/dL   Albumin 2.5 (L) 3.5 - 5.0 g/dL   AST 18 15 - 41 U/L   ALT 15 0 - 44 U/L    Alkaline Phosphatase 95 38 - 126 U/L   Total Bilirubin <0.2 0.0 - 1.2 mg/dL   GFR, Estimated >39 >39 mL/min   Anion gap 11 5 - 15  CBC     Status: Abnormal   Collection Time: 05/19/24  6:52 AM  Result Value Ref Range   WBC 7.0 4.0 - 10.5 K/uL   RBC 4.33 3.87 - 5.11 MIL/uL   Hemoglobin 11.8 (L) 12.0 - 15.0 g/dL   HCT 62.9 63.9 - 53.9 %   MCV 85.5 80.0 - 100.0 fL   MCH 27.3 26.0 - 34.0 pg   MCHC 31.9 30.0 - 36.0 g/dL   RDW 86.6 88.4 - 84.4 %   Platelets 260 150 - 400 K/uL   nRBC 0.0 0.0 - 0.2 %  hCG, serum, qualitative     Status: Abnormal   Collection Time: 05/19/24  6:52 AM  Result Value Ref Range   Preg, Serum POSITIVE (A) NEGATIVE  Urinalysis, Routine w reflex microscopic -Urine, Clean Catch     Status:  Abnormal   Collection Time: 05/19/24  7:02 AM  Result Value Ref Range   Color, Urine YELLOW YELLOW   APPearance CLOUDY (A) CLEAR   Specific Gravity, Urine 1.019 1.005 - 1.030   pH 6.0 5.0 - 8.0   Glucose, UA NEGATIVE NEGATIVE mg/dL   Hgb urine dipstick NEGATIVE NEGATIVE   Bilirubin Urine NEGATIVE NEGATIVE   Ketones, ur NEGATIVE NEGATIVE mg/dL   Protein, ur 30 (A) NEGATIVE mg/dL   Nitrite NEGATIVE NEGATIVE   Leukocytes,Ua SMALL (A) NEGATIVE   RBC / HPF 0-5 0 - 5 RBC/hpf   WBC, UA 21-50 0 - 5 WBC/hpf   Bacteria, UA RARE (A) NONE SEEN   Squamous Epithelial / HPF 11-20 0 - 5 /HPF   Mucus PRESENT    Amorphous Crystal PRESENT   Wet prep, genital     Status: Abnormal   Collection Time: 05/19/24  8:49 AM   Specimen: PATH Cytology Cervicovaginal Ancillary Only  Result Value Ref Range   Yeast Wet Prep HPF POC NONE SEEN NONE SEEN   Trich, Wet Prep NONE SEEN NONE SEEN   Clue Cells Wet Prep HPF POC PRESENT (A) NONE SEEN   WBC, Wet Prep HPF POC <10 <10   Sperm NONE SEEN     MDM: MAU Course: -Vital signs within normal limits. -Abdomen gravid. -Reviewed CBC, CMP, lipase, UA from ED workup. -CBC and CMP without abnormalities for pregnancy. -UA with leukocytes and bacteria,  will send culture. -Collecting wet prep and GC/CT as well. -Establish care with OBGYN and schedule anatomy US .  A 1. Pregnant and not yet delivered, second trimester (Primary) - Discharge patient  2. Dysuria - Discharge patient  3. Currently pregnant in second trimester with unknown gestational age - Discharge patient  Medical screening exam complete  P Discharge from MAU in stable condition with preterm labor precautions Follow up with OBGYN for prenatal care and schedule anatomy US . Continue prenatal vitamins.  Allergies as of 05/19/2024       Reactions   Morphine And Codeine Hives   Vicodin [hydrocodone -acetaminophen ] Hives   Pt states she can take acetaminophen         Medication List     TAKE these medications    fluticasone  50 MCG/ACT nasal spray Commonly known as: FLONASE  Spray two sprays into both nostrils once a day for allergies   multivitamin-prenatal 27-0.8 MG Tabs tablet Take 1 tablet by mouth daily at 12 noon.   Ventolin  HFA 108 (90 Base) MCG/ACT inhaler Generic drug: albuterol  INHALE 2 PUFFS EVERY 4-6 HOURS AS NEEDED FOR WHEEZING OR SHORTNESS OF BREATH        Joesph DELENA Sear, PA

## 2024-05-20 LAB — GC/CHLAMYDIA PROBE AMP (~~LOC~~) NOT AT ARMC
Chlamydia: NEGATIVE
Comment: NEGATIVE
Comment: NORMAL
Neisseria Gonorrhea: NEGATIVE

## 2024-05-21 LAB — CULTURE, OB URINE: Culture: >=100000 — AB

## 2024-05-22 ENCOUNTER — Encounter: Payer: Self-pay | Admitting: Nurse Practitioner

## 2024-05-22 ENCOUNTER — Other Ambulatory Visit: Payer: Self-pay | Admitting: Nurse Practitioner

## 2024-05-22 DIAGNOSIS — O2342 Unspecified infection of urinary tract in pregnancy, second trimester: Secondary | ICD-10-CM

## 2024-05-22 MED ORDER — NITROFURANTOIN MONOHYD MACRO 100 MG PO CAPS
100.0000 mg | ORAL_CAPSULE | Freq: Two times a day (BID) | ORAL | 0 refills | Status: AC
Start: 2024-05-22 — End: ?

## 2024-05-23 ENCOUNTER — Ambulatory Visit: Payer: Self-pay | Admitting: Family Medicine

## 2024-06-08 ENCOUNTER — Encounter (HOSPITAL_BASED_OUTPATIENT_CLINIC_OR_DEPARTMENT_OTHER): Payer: Self-pay | Admitting: Certified Nurse Midwife
